# Patient Record
Sex: Female | Born: 1965 | Race: White | Hispanic: No | Smoking: Former smoker
Health system: Southern US, Community
[De-identification: ages and names within clinical notes are randomized; demographics above are authoritative.]

## PROBLEM LIST (undated history)

## (undated) HISTORY — PX: BREAST ENHANCEMENT SURGERY: SHX7

## (undated) HISTORY — PX: TUBAL LIGATION: SHX77

---

## 2003-12-07 ENCOUNTER — Other Ambulatory Visit: Payer: Self-pay

## 2005-03-15 ENCOUNTER — Ambulatory Visit: Payer: Self-pay | Admitting: Internal Medicine

## 2008-01-03 ENCOUNTER — Ambulatory Visit: Payer: Self-pay | Admitting: Unknown Physician Specialty

## 2008-11-01 HISTORY — PX: AUGMENTATION MAMMAPLASTY: SUR837

## 2009-04-06 ENCOUNTER — Emergency Department: Payer: Self-pay | Admitting: Emergency Medicine

## 2009-11-01 HISTORY — PX: ABDOMINAL HYSTERECTOMY: SHX81

## 2009-11-01 HISTORY — PX: OOPHORECTOMY: SHX86

## 2010-04-07 ENCOUNTER — Emergency Department: Payer: Self-pay | Admitting: Emergency Medicine

## 2010-05-29 ENCOUNTER — Ambulatory Visit: Payer: Self-pay | Admitting: Unknown Physician Specialty

## 2010-06-08 ENCOUNTER — Ambulatory Visit: Payer: Self-pay | Admitting: Unknown Physician Specialty

## 2010-06-10 ENCOUNTER — Ambulatory Visit: Payer: Self-pay | Admitting: Unknown Physician Specialty

## 2011-02-08 ENCOUNTER — Emergency Department: Payer: Self-pay | Admitting: Internal Medicine

## 2011-02-17 ENCOUNTER — Ambulatory Visit: Payer: Self-pay | Admitting: Urology

## 2011-04-23 ENCOUNTER — Emergency Department: Payer: Self-pay | Admitting: Emergency Medicine

## 2011-07-28 ENCOUNTER — Emergency Department: Payer: Self-pay | Admitting: Emergency Medicine

## 2011-07-31 ENCOUNTER — Ambulatory Visit: Payer: Self-pay | Admitting: Unknown Physician Specialty

## 2011-08-02 ENCOUNTER — Ambulatory Visit: Payer: Self-pay | Admitting: Gynecologic Oncology

## 2011-08-02 ENCOUNTER — Ambulatory Visit: Payer: Self-pay | Admitting: Internal Medicine

## 2011-08-03 ENCOUNTER — Ambulatory Visit: Payer: Self-pay

## 2011-08-09 ENCOUNTER — Ambulatory Visit: Payer: Self-pay

## 2011-08-10 ENCOUNTER — Ambulatory Visit: Payer: Self-pay | Admitting: Gynecologic Oncology

## 2011-08-18 LAB — CA 125: CA 125: 16.5 U/mL (ref 0.0–34.0)

## 2011-08-28 ENCOUNTER — Ambulatory Visit: Payer: Self-pay | Admitting: Unknown Physician Specialty

## 2011-09-02 ENCOUNTER — Ambulatory Visit: Payer: Self-pay | Admitting: Gynecologic Oncology

## 2011-10-06 ENCOUNTER — Ambulatory Visit: Payer: Self-pay | Admitting: Gynecologic Oncology

## 2011-10-28 LAB — HM COLONOSCOPY

## 2012-03-02 ENCOUNTER — Ambulatory Visit: Payer: Self-pay | Admitting: Internal Medicine

## 2012-05-29 ENCOUNTER — Ambulatory Visit: Payer: Self-pay

## 2012-05-29 LAB — URINALYSIS, COMPLETE
Ketone: NEGATIVE
Ph: 6 (ref 4.5–8.0)
Protein: NEGATIVE
Specific Gravity: 1.025 (ref 1.003–1.030)

## 2013-02-22 ENCOUNTER — Ambulatory Visit: Payer: Self-pay

## 2013-02-22 LAB — URINALYSIS, COMPLETE
Bilirubin,UR: NEGATIVE
Blood: NEGATIVE
Ketone: NEGATIVE
Leukocyte Esterase: NEGATIVE
Ph: 6 (ref 4.5–8.0)
Protein: NEGATIVE
Specific Gravity: >=1.03 (ref 1.003–1.030)
WBC UR: NONE SEEN /HPF (ref 0–5)

## 2013-02-24 LAB — URINE CULTURE

## 2013-09-04 LAB — CBC AND DIFFERENTIAL
HEMATOCRIT: 41 % (ref 36–46)
HEMOGLOBIN: 14.4 g/dL (ref 12.0–16.0)

## 2013-09-04 LAB — TSH: TSH: 1.45 u[IU]/mL (ref 0.41–5.90)

## 2015-09-30 ENCOUNTER — Encounter: Payer: Self-pay | Admitting: Internal Medicine

## 2015-09-30 ENCOUNTER — Other Ambulatory Visit: Payer: Self-pay | Admitting: Internal Medicine

## 2015-09-30 DIAGNOSIS — H1010 Acute atopic conjunctivitis, unspecified eye: Secondary | ICD-10-CM | POA: Insufficient documentation

## 2015-09-30 DIAGNOSIS — J4520 Mild intermittent asthma, uncomplicated: Secondary | ICD-10-CM | POA: Insufficient documentation

## 2015-09-30 DIAGNOSIS — L719 Rosacea, unspecified: Secondary | ICD-10-CM | POA: Insufficient documentation

## 2015-09-30 DIAGNOSIS — K219 Gastro-esophageal reflux disease without esophagitis: Secondary | ICD-10-CM | POA: Insufficient documentation

## 2015-09-30 DIAGNOSIS — F17201 Nicotine dependence, unspecified, in remission: Secondary | ICD-10-CM | POA: Insufficient documentation

## 2015-09-30 DIAGNOSIS — F909 Attention-deficit hyperactivity disorder, unspecified type: Secondary | ICD-10-CM | POA: Insufficient documentation

## 2015-09-30 DIAGNOSIS — M26629 Arthralgia of temporomandibular joint, unspecified side: Secondary | ICD-10-CM | POA: Insufficient documentation

## 2016-11-30 ENCOUNTER — Other Ambulatory Visit: Payer: Self-pay | Admitting: Internal Medicine

## 2017-01-10 ENCOUNTER — Ambulatory Visit: Payer: Self-pay | Admitting: Internal Medicine

## 2017-01-11 ENCOUNTER — Encounter: Payer: Self-pay | Admitting: Internal Medicine

## 2017-01-11 ENCOUNTER — Ambulatory Visit (INDEPENDENT_AMBULATORY_CARE_PROVIDER_SITE_OTHER): Payer: BLUE CROSS/BLUE SHIELD | Admitting: Internal Medicine

## 2017-01-11 VITALS — BP 94/62 | HR 88 | Ht 68.0 in | Wt 274.0 lb

## 2017-01-11 DIAGNOSIS — J4520 Mild intermittent asthma, uncomplicated: Secondary | ICD-10-CM | POA: Diagnosis not present

## 2017-01-11 DIAGNOSIS — F17200 Nicotine dependence, unspecified, uncomplicated: Secondary | ICD-10-CM

## 2017-01-11 DIAGNOSIS — R21 Rash and other nonspecific skin eruption: Secondary | ICD-10-CM | POA: Diagnosis not present

## 2017-01-11 MED ORDER — TRIAMCINOLONE ACETONIDE 0.1 % EX CREA: 1.0000 | TOPICAL_CREAM | Freq: Two times a day (BID) | CUTANEOUS | 0 refills | Status: DC

## 2017-01-11 MED ORDER — FLUCONAZOLE 100 MG PO TABS: 100.0000 mg | ORAL_TABLET | Freq: Every day | ORAL | 0 refills | Status: DC

## 2017-01-11 MED ORDER — MOMETASONE FURO-FORMOTEROL FUM 200-5 MCG/ACT IN AERO: 2.0000 | INHALATION_SPRAY | Freq: Every day | RESPIRATORY_TRACT | 12 refills | Status: DC

## 2017-01-11 NOTE — Progress Notes (Signed)
Date:  01/11/2017   Name:  Shannon Stanton R Marasigan   DOB:  12/22/1965   MRN:  [id number]   Chief Complaint: Rash (Started on left arm and spread. Itches, and red. Started 1 year ago, but getting worse. Has on face, arms, land legs. Skin feels dry.) and Foreign Body in Ear (When pt moves jaw, she feels like somethin moves in Left ear. Wants to make sure nothing is in there.) Rash  This is a chronic problem. The current episode started more than 1 year ago. The problem has been gradually worsening since onset. The affected locations include the chest, left arm, left lower leg, right arm and right lower leg. The rash is characterized by dryness, redness and itchiness. She was exposed to nothing. Pertinent negatives include no cough, fatigue, fever or shortness of breath. Past treatments include antihistamine, anti-itch cream and moisturizer. The treatment provided no relief. Her past medical history is significant for asthma.  Asthma  She complains of chest tightness and wheezing. There is no cough or shortness of breath. This is a recurrent problem. The problem occurs intermittently. The problem has been waxing and waning. Associated symptoms include ear pain. Pertinent negatives include no chest pain or fever. Her symptoms are alleviated by beta-agonist and steroid inhaler. She reports complete improvement on treatment. Risk factors for lung disease include smoking/tobacco exposure. Her past medical history is significant for asthma.   She feels like there is something in her ear canal on occasion.  No known FB.  No pain or bleeding.     Review of Systems  Constitutional: Negative for chills, fatigue and fever.  HENT: Positive for ear pain.   Eyes: Negative for visual disturbance.  Respiratory: Positive for wheezing. Negative for cough, chest tightness and shortness of breath.   Cardiovascular: Negative for chest pain, palpitations and leg swelling.  Skin: Positive for rash.    Patient Active  Problem List   Diagnosis Date Noted  . Compulsive tobacco user syndrome 09/30/2015  . Attention deficit disorder with hyperactivity 09/30/2015  . Allergic conjunctivitis 09/30/2015  . Asthma, mild intermittent 09/30/2015  . Acid reflux 09/30/2015  . Acne erythematosa 09/30/2015  . Arthralgia of temporomandibular joint 09/30/2015    Prior to Admission medications   Medication Sig Start Date End Date Taking? Authorizing Provider  fexofenadine (ALLEGRA) 30 MG tablet Take 30 mg by mouth 2 (two) times daily.   Yes Historical Provider, MD  mometasone-formoterol (DULERA) 200-5 MCG/ACT AERO Inhale 2 puffs into the lungs daily. 02/06/15  Yes Historical Provider, MD    Allergies  Allergen Reactions  . Augmentin [Amoxicillin-Pot Clavulanate] Other (See Comments)    Liver shuts down  . Meloxicam Shortness Of Breath  . Hydrocodone Rash  . Penicillins Rash    Past Surgical History:  Procedure Laterality Date  . ABDOMINAL HYSTERECTOMY  2011   endometrial dysplasia  . BREAST ENHANCEMENT SURGERY    . OOPHORECTOMY Right 2011  . TUBAL LIGATION      Social History  Substance Use Topics  . Smoking status: Current Every Day Smoker  . Smokeless tobacco: Never Used  . Alcohol use 1.2 oz/week    2 Standard drinks or equivalent per week     Medication list has been reviewed and updated.   Physical Exam  Constitutional: She is oriented to person, place, and time. She appears well-developed. No distress.  HENT:  Head: Normocephalic and atraumatic.  Right Ear: Tympanic membrane and ear canal normal.  Left Ear: Tympanic membrane  and ear canal normal.  Neck: Normal range of motion.  Cardiovascular: Normal rate, regular rhythm and normal heart sounds.   Pulmonary/Chest: Effort normal and breath sounds normal. No respiratory distress.  Musculoskeletal: Normal range of motion.  Neurological: She is alert and oriented to person, place, and time.  Skin: Skin is warm and dry. No rash noted.    Diffuse thickening c/w chronic excoriation over forearms and chest.  Tiny raised papules without purulent scattered throughout. No vesicles.  No plaques.  Psychiatric: She has a normal mood and affect. Her speech is normal and behavior is normal. Thought content normal.  Nursing note and vitals reviewed.   BP 94/62   Pulse 88   Ht 5\' 8"  (1.727 m)   Wt 274 lb (124.3 kg)   SpO2 97%   BMI 41.66 kg/m   Assessment and Plan: 1. Rash May need to consult Dermatology - triamcinolone cream (KENALOG) 0.1 %; Apply 1 application topically 2 (two) times daily.  Dispense: 30 g; Refill: 0 - fluconazole (DIFLUCAN) 100 MG tablet; Take 1 tablet (100 mg total) by mouth daily.  Dispense: 10 tablet; Refill: 0  2. Mild intermittent asthma without complication Doing well on maintenance inhaler - mometasone-formoterol (DULERA) 200-5 MCG/ACT AERO; Inhale 2 puffs into the lungs daily.  Dispense: 1 Inhaler; Refill: 12  3. Compulsive tobacco user syndrome Encouraged to work on quitting   Meds ordered this encounter  Medications  . mometasone-formoterol (DULERA) 200-5 MCG/ACT AERO    Sig: Inhale 2 puffs into the lungs daily.    Dispense:  1 Inhaler    Refill:  12  . triamcinolone cream (KENALOG) 0.1 %    Sig: Apply 1 application topically 2 (two) times daily.    Dispense:  30 g    Refill:  0  . fluconazole (DIFLUCAN) 100 MG tablet    Sig: Take 1 tablet (100 mg total) by mouth daily.    Dispense:  10 tablet    Refill:  0    Bari Edward, MD [facility]  01/11/2017

## 2017-02-15 DIAGNOSIS — D2261 Melanocytic nevi of right upper limb, including shoulder: Secondary | ICD-10-CM | POA: Diagnosis not present

## 2017-02-15 DIAGNOSIS — L309 Dermatitis, unspecified: Secondary | ICD-10-CM | POA: Diagnosis not present

## 2017-03-01 DIAGNOSIS — L309 Dermatitis, unspecified: Secondary | ICD-10-CM | POA: Diagnosis not present

## 2017-10-11 ENCOUNTER — Ambulatory Visit: Payer: BLUE CROSS/BLUE SHIELD | Admitting: Internal Medicine

## 2017-10-11 ENCOUNTER — Encounter: Payer: Self-pay | Admitting: Internal Medicine

## 2017-10-11 VITALS — BP 122/78 | HR 112 | Temp 98.0°F | Ht 68.0 in | Wt 180.0 lb

## 2017-10-11 DIAGNOSIS — N951 Menopausal and female climacteric states: Secondary | ICD-10-CM | POA: Insufficient documentation

## 2017-10-11 DIAGNOSIS — F17201 Nicotine dependence, unspecified, in remission: Secondary | ICD-10-CM

## 2017-10-11 DIAGNOSIS — N3000 Acute cystitis without hematuria: Secondary | ICD-10-CM | POA: Diagnosis not present

## 2017-10-11 LAB — POCT URINALYSIS DIPSTICK
Bilirubin, UA: NEGATIVE
Blood, UA: NEGATIVE
Glucose, UA: NEGATIVE
Ketones, UA: NEGATIVE
NITRITE UA: NEGATIVE
SPEC GRAV UA: 1.015 (ref 1.010–1.025)
UROBILINOGEN UA: 0.2 U/dL
pH, UA: 5 (ref 5.0–8.0)

## 2017-10-11 MED ORDER — SULFAMETHOXAZOLE-TRIMETHOPRIM 800-160 MG PO TABS: 1.0000 | ORAL_TABLET | Freq: Two times a day (BID) | ORAL | 0 refills | Status: AC

## 2017-10-11 NOTE — Progress Notes (Signed)
Date:  10/11/2017   Name:  Shannon Stanton   DOB:  07/04/1966   MRN:  [id number]   Chief Complaint: Urinary Tract Infection (Tried lots of meds at home including Azo, dicylclomine antibiotics. Started last Tuesday - a lot of burning when urinating and discomfort in bladder area. ); Menopause (Night sweats are bad, not sleeping well. Wants to know what can help.); and Sore Throat (Started yesterday- coughing with production. Not a color- clear. ) Urinary Tract Infection   This is a new problem. The current episode started in the past 7 days. The problem has been unchanged. The quality of the pain is described as burning. The pain is mild. There has been no fever. Pertinent negatives include no chills.  Sore Throat   Pertinent negatives include no coughing, headaches or shortness of breath.   Menopause sx - s/p hysterectomy.  Started with some sx several years ago, the symptoms are worsening - severe sweats at night, and hot during the day.  She has not taken any medications.  Review of Systems  Constitutional: Positive for diaphoresis. Negative for chills, fatigue and fever.  HENT: Positive for sore throat. Negative for postnasal drip and sinus pain.   Respiratory: Negative for cough, chest tightness, shortness of breath and wheezing.   Cardiovascular: Negative for chest pain and palpitations.  Musculoskeletal: Negative for arthralgias and gait problem.  Skin: Positive for rash.  Neurological: Negative for dizziness, tremors, weakness and headaches.  Psychiatric/Behavioral: Positive for sleep disturbance. Negative for dysphoric mood.    Patient Active Problem List   Diagnosis Date Noted  . Compulsive tobacco user syndrome 09/30/2015  . Attention deficit disorder with hyperactivity 09/30/2015  . Allergic conjunctivitis 09/30/2015  . Asthma, mild intermittent 09/30/2015  . Acid reflux 09/30/2015  . Acne erythematosa 09/30/2015  . Arthralgia of temporomandibular joint 09/30/2015     Prior to Admission medications   Medication Sig Start Date End Date Taking? Authorizing Provider  fexofenadine (ALLEGRA) 30 MG tablet Take 30 mg by mouth 2 (two) times daily.   Yes [provider]  mometasone-formoterol (DULERA) 200-5 MCG/ACT AERO Inhale 2 puffs into the lungs daily. 01/11/17  Yes Reubin MilanBerglund, Lyrika Souders H, MD  triamcinolone cream (KENALOG) 0.1 % Apply 1 application topically 2 (two) times daily. 01/11/17  Yes Reubin MilanBerglund, Ryna Beckstrom H, MD    Allergies  Allergen Reactions  . Augmentin [Amoxicillin-Pot Clavulanate] Other (See Comments)    Liver shuts down  . Meloxicam Shortness Of Breath  . Hydrocodone Rash  . Penicillins Rash    Past Surgical History:  Procedure Laterality Date  . ABDOMINAL HYSTERECTOMY  2011   endometrial dysplasia  . BREAST ENHANCEMENT SURGERY    . OOPHORECTOMY Right 2011  . TUBAL LIGATION      Social History   Tobacco Use  . Smoking status: Former Smoker    Last attempt to quit: 05/2017    Years since quitting: 0.4  . Smokeless tobacco: Never Used  Substance Use Topics  . Alcohol use: Yes    Alcohol/week: 1.2 oz    Types: 2 Standard drinks or equivalent per week  . Drug use: Not on file     Medication list has been reviewed and updated.  PHQ 2/9 Scores 10/11/2017  PHQ - 2 Score 0    Physical Exam  Constitutional: She is oriented to person, place, and time. She appears well-developed. No distress.  HENT:  Head: Normocephalic and atraumatic.  Nose: Right sinus exhibits no maxillary sinus tenderness. Left sinus  exhibits no maxillary sinus tenderness.  Mouth/Throat: No posterior oropharyngeal edema or posterior oropharyngeal erythema.  Neck: Normal range of motion. Neck supple.  Cardiovascular: Normal rate, regular rhythm and normal heart sounds.  Pulmonary/Chest: Effort normal and breath sounds normal. No respiratory distress. She has no wheezes.  Abdominal: Soft. Bowel sounds are normal. She exhibits no distension. There is no  tenderness.  Musculoskeletal: Normal range of motion. She exhibits no edema.  Neurological: She is alert and oriented to person, place, and time.  Skin: Skin is warm and dry. No rash noted.  Psychiatric: She has a normal mood and affect. Her behavior is normal. Thought content normal.  Nursing note and vitals reviewed.   BP 122/78   Pulse (!) 112   Temp 98 F (36.7 C) (Oral)   Ht 5\' 8"  (1.727 m)   Wt 180 lb (81.6 kg)   SpO2 97%   BMI 27.37 kg/m   Assessment and Plan: 1. Acute cystitis without hematuria Push fluids - POCT urinalysis dipstick - sulfamethoxazole-trimethoprim (BACTRIM DS,SEPTRA DS) 800-160 MG tablet; Take 1 tablet by mouth 2 (two) times daily for 10 days.  Dispense: 20 tablet; Refill: 0  2. Menopausal symptoms Begin otc black cohosh or soy  3. Tobacco use disorder, mild, in sustained remission   Meds ordered this encounter  Medications  . sulfamethoxazole-trimethoprim (BACTRIM DS,SEPTRA DS) 800-160 MG tablet    Sig: Take 1 tablet by mouth 2 (two) times daily for 10 days.    Dispense:  20 tablet    Refill:  0    Partially dictated using Animal nutritionistDragon software. Any errors are unintentional.  Bari EdwardLaura Grayce Budden, MD [facility]  10/11/2017

## 2017-10-11 NOTE — Patient Instructions (Signed)
Get a soy or black cohosh supplement (ideally a combination product)

## 2017-11-03 ENCOUNTER — Other Ambulatory Visit: Payer: Self-pay | Admitting: Internal Medicine

## 2017-11-03 DIAGNOSIS — Z1231 Encounter for screening mammogram for malignant neoplasm of breast: Secondary | ICD-10-CM

## 2017-11-14 ENCOUNTER — Ambulatory Visit
Admission: RE | Admit: 2017-11-14 | Discharge: 2017-11-14 | Disposition: A | Discharge status: Home / Self Care | Payer: BLUE CROSS/BLUE SHIELD | Source: Ambulatory Visit | Attending: Internal Medicine | Admitting: Internal Medicine

## 2017-11-14 ENCOUNTER — Encounter (INDEPENDENT_AMBULATORY_CARE_PROVIDER_SITE_OTHER): Payer: Self-pay

## 2017-11-14 DIAGNOSIS — Z1231 Encounter for screening mammogram for malignant neoplasm of breast: Secondary | ICD-10-CM | POA: Diagnosis not present

## 2018-08-17 ENCOUNTER — Other Ambulatory Visit: Payer: Self-pay | Admitting: Internal Medicine

## 2018-08-17 DIAGNOSIS — J4520 Mild intermittent asthma, uncomplicated: Secondary | ICD-10-CM

## 2019-05-10 ENCOUNTER — Encounter: Payer: Self-pay | Admitting: Internal Medicine

## 2019-05-10 ENCOUNTER — Other Ambulatory Visit: Payer: Self-pay

## 2019-05-10 ENCOUNTER — Ambulatory Visit (INDEPENDENT_AMBULATORY_CARE_PROVIDER_SITE_OTHER): Payer: BC Managed Care – PPO | Admitting: Internal Medicine

## 2019-05-10 VITALS — BP 102/64 | HR 76 | Temp 97.7°F | Ht 68.0 in | Wt 188.0 lb

## 2019-05-10 DIAGNOSIS — Z23 Encounter for immunization: Secondary | ICD-10-CM

## 2019-05-10 DIAGNOSIS — F17201 Nicotine dependence, unspecified, in remission: Secondary | ICD-10-CM | POA: Diagnosis not present

## 2019-05-10 DIAGNOSIS — J4520 Mild intermittent asthma, uncomplicated: Secondary | ICD-10-CM | POA: Diagnosis not present

## 2019-05-10 DIAGNOSIS — Z1231 Encounter for screening mammogram for malignant neoplasm of breast: Secondary | ICD-10-CM

## 2019-05-10 DIAGNOSIS — N951 Menopausal and female climacteric states: Secondary | ICD-10-CM

## 2019-05-10 DIAGNOSIS — Z0000 Encounter for general adult medical examination without abnormal findings: Secondary | ICD-10-CM | POA: Diagnosis not present

## 2019-05-10 DIAGNOSIS — E538 Deficiency of other specified B group vitamins: Secondary | ICD-10-CM | POA: Diagnosis not present

## 2019-05-10 LAB — POCT URINALYSIS DIPSTICK
Bilirubin, UA: NEGATIVE
Blood, UA: NEGATIVE
Glucose, UA: NEGATIVE
Ketones, UA: NEGATIVE
Leukocytes, UA: NEGATIVE
Nitrite, UA: NEGATIVE
Protein, UA: NEGATIVE
Spec Grav, UA: >=1.03 — AB (ref 1.010–1.025)
Urobilinogen, UA: 0.2 E.U./dL
pH, UA: 6 (ref 5.0–8.0)

## 2019-05-10 MED ORDER — ALBUTEROL SULFATE HFA 108 (90 BASE) MCG/ACT IN AERS: 2.0000 | INHALATION_SPRAY | Freq: Four times a day (QID) | RESPIRATORY_TRACT | 5 refills | Status: DC | PRN

## 2019-05-10 MED ORDER — VENLAFAXINE HCL ER 37.5 MG PO CP24: 37.5000 mg | ORAL_CAPSULE | Freq: Every day | ORAL | 3 refills | Status: DC

## 2019-05-10 NOTE — Progress Notes (Signed)
Date:  05/10/2019   Name:  Shannon Stanton   DOB:  1966-06-16   MRN:  [id number]   Chief Complaint: Annual Exam (breast exam. discuss menopause symptoms.) Shannon Stanton is a 54 y.o. female who presents today for her Complete Annual Exam. She feels well. She reports exercising. She reports she is sleeping poorly due to night sweats. She denies breast issues.  Colonoscopy 2012 Mammogram 2019 No pap needed (hysterectomy)  Asthma She complains of wheezing. There is no cough or shortness of breath. The problem occurs every several days. Pertinent negatives include no chest pain, fever, headaches or trouble swallowing. Her symptoms are aggravated by change in weather (wearing a mask). Her symptoms are alleviated by beta-agonist and steroid inhaler. She reports complete improvement on treatment. Her past medical history is significant for asthma.   Menopausal sx - she is still struggling with hot flashes and night sweats as well as mood disorder.  She is reluctant to take medications.  She is not a candidate for HRT due to family hx breast cancer.  Her significant others sees extreme mood swings as well.   Review of Systems  Constitutional: Positive for diaphoresis. Negative for chills and fever.  HENT: Negative for congestion, hearing loss, tinnitus, trouble swallowing and voice change.   Eyes: Negative for visual disturbance.  Respiratory: Positive for wheezing. Negative for cough, chest tightness and shortness of breath.   Cardiovascular: Negative for chest pain, palpitations and leg swelling.  Gastrointestinal: Negative for abdominal pain, constipation, diarrhea and vomiting.  Endocrine: Negative for polydipsia and polyuria.  Genitourinary: Negative for dysuria, frequency, genital sores, vaginal bleeding and vaginal discharge.  Musculoskeletal: Negative for arthralgias, gait problem and joint swelling.  Skin: Negative for color change and rash.  Allergic/Immunologic: Negative for  environmental allergies.  Neurological: Negative for dizziness, tremors, light-headedness and headaches.  Hematological: Negative for adenopathy. Does not bruise/bleed easily.  Psychiatric/Behavioral: Positive for sleep disturbance. Negative for dysphoric mood. The patient is not nervous/anxious.     Patient Active Problem List   Diagnosis Date Noted  . Menopausal symptoms 10/11/2017  . Tobacco use disorder, mild, in sustained remission 09/30/2015  . Attention deficit disorder with hyperactivity 09/30/2015  . Asthma, mild intermittent 09/30/2015  . Acid reflux 09/30/2015  . Acne erythematosa 09/30/2015  . Arthralgia of temporomandibular joint 09/30/2015    Allergies  Allergen Reactions  . Augmentin [Amoxicillin-Pot Clavulanate] Other (See Comments)    Liver shuts down  . Meloxicam Shortness Of Breath  . Hydrocodone Rash  . Penicillins Rash    Past Surgical History:  Procedure Laterality Date  . ABDOMINAL HYSTERECTOMY  2011   endometrial dysplasia  . AUGMENTATION MAMMAPLASTY Bilateral 2010  . OOPHORECTOMY Right 2011  . TUBAL LIGATION      Social History   Tobacco Use  . Smoking status: Former Smoker    Quit date: 05/2017    Years since quitting: 2.0  . Smokeless tobacco: Never Used  Substance Use Topics  . Alcohol use: Yes    Alcohol/week: 2.0 standard drinks    Types: 2 Standard drinks or equivalent per week  . Drug use: Not on file     Medication list has been reviewed and updated.  Current Meds  Medication Sig  . DULERA 200-5 MCG/ACT AERO INHALE 2 PUFFS INTO THE LUNGS DAILY.  . fexofenadine (ALLEGRA) 30 MG tablet Take 30 mg by mouth 2 (two) times daily.  Marland Kitchen triamcinolone cream (KENALOG) 0.1 % Apply 1 application topically 2 (two)  times daily.    PHQ 2/9 Scores 05/10/2019 10/11/2017  PHQ - 2 Score 0 0  PHQ- 9 Score 9 -    BP Readings from Last 3 Encounters:  05/10/19 102/64  10/11/17 122/78  01/11/17 94/62    Physical Exam Vitals signs and nursing  note reviewed.  Constitutional:      General: She is not in acute distress.    Appearance: She is well-developed.  HENT:     Head: Normocephalic and atraumatic.     Right Ear: Tympanic membrane and ear canal normal.     Left Ear: Tympanic membrane and ear canal normal.     Nose:     Right Sinus: No maxillary sinus tenderness.     Left Sinus: No maxillary sinus tenderness.     Mouth/Throat:     Pharynx: Uvula midline.  Eyes:     General: No scleral icterus.       Right eye: No discharge.        Left eye: No discharge.     Conjunctiva/sclera: Conjunctivae normal.  Neck:     Musculoskeletal: Normal range of motion. No erythema.     Thyroid: No thyromegaly.     Vascular: No carotid bruit.  Cardiovascular:     Rate and Rhythm: Normal rate and regular rhythm.     Pulses: Normal pulses.     Heart sounds: Normal heart sounds.  Pulmonary:     Effort: Pulmonary effort is normal. No respiratory distress.     Breath sounds: No wheezing.  Chest:     Breasts:        Right: No mass, nipple discharge, skin change or tenderness.        Left: No mass, nipple discharge, skin change or tenderness.  Abdominal:     General: Bowel sounds are normal.     Palpations: Abdomen is soft.     Tenderness: There is no abdominal tenderness.  Musculoskeletal: Normal range of motion.     Right lower leg: No edema.     Left lower leg: No edema.  Lymphadenopathy:     Cervical: No cervical adenopathy.  Skin:    General: Skin is warm and dry.     Capillary Refill: Capillary refill takes less than 2 seconds.     Findings: No rash.  Neurological:     Mental Status: She is alert and oriented to person, place, and time.     Cranial Nerves: No cranial nerve deficit.     Sensory: No sensory deficit.     Deep Tendon Reflexes: Reflexes are normal and symmetric.  Psychiatric:        Attention and Perception: Attention normal.        Mood and Affect: Mood normal.        Speech: Speech normal.        Behavior:  Behavior normal.        Thought Content: Thought content normal.     Wt Readings from Last 3 Encounters:  05/10/19 188 lb (85.3 kg)  10/11/17 180 lb (81.6 kg)  01/11/17 274 lb (124.3 kg)    BP 102/64   Pulse 76   Temp 97.7 F (36.5 C) (Temporal)   Ht 5\' 8"  (1.727 m)   Wt 188 lb (85.3 kg)   SpO2 96%   BMI 28.59 kg/m   Assessment and Plan: 1. Annual physical exam Normal exam Continue healthy diet and exercise - Comprehensive metabolic panel - Lipid panel - POCT urinalysis dipstick - TSH -  Vitamin B12  2. Encounter for screening [facility] - MM 3D SCREEN BREAST BILATERAL; Future  3. Mild intermittent asthma without complication Doing well, continue current daily treatment - CBC with Differential/Platelet - albuterol (VENTOLIN HFA) 108 (90 Base) MCG/ACT inhaler; Inhale 2 puffs into the lungs every 6 (six) hours as needed for wheezing or shortness of breath.  Dispense: 18 g; Refill: 5  4. Tobacco use disorder, mild, in sustained remission Remains tobacco free  5. Menopausal symptoms Pt urged to try low dose effexor - Rx given and she will consider trying it - venlafaxine XR (EFFEXOR XR) 37.5 MG 24 hr capsule; Take 1 capsule (37.5 mg total) by mouth daily with breakfast.  Dispense: 30 capsule; Refill: 3  6. Need for diphtheria-tetanus-pertussis (Tdap) vaccine - Tdap vaccine greater than or equal to 7yo IM   Partially dictated using Animal nutritionistDragon software. Any errors are unintentional.  Bari EdwardLaura Rasmus Preusser, MD [facility]  05/10/2019

## 2019-05-11 LAB — COMPREHENSIVE METABOLIC PANEL
ALT: 10 IU/L (ref 0–32)
AST: 18 IU/L (ref 0–40)
Albumin/Globulin Ratio: 2.1 (ref 1.2–2.2)
Albumin: 4.7 g/dL (ref 3.8–4.9)
Alkaline Phosphatase: 78 IU/L (ref 39–117)
BUN/Creatinine Ratio: 15 (ref 9–23)
BUN: 16 mg/dL (ref 6–24)
Bilirubin Total: 1.1 mg/dL (ref 0.0–1.2)
CO2: 22 mmol/L (ref 20–29)
Calcium: 9.6 mg/dL (ref 8.7–10.2)
Chloride: 102 mmol/L (ref 96–106)
Creatinine, Ser: 1.1 mg/dL — ABNORMAL HIGH (ref 0.57–1.00)
GFR calc Af Amer: 67 mL/min/{1.73_m2} (ref >59)
GFR calc non Af Amer: 58 mL/min/{1.73_m2} — ABNORMAL LOW (ref >59)
Globulin, Total: 2.2 g/dL (ref 1.5–4.5)
Glucose: 91 mg/dL (ref 65–99)
Potassium: 4.6 mmol/L (ref 3.5–5.2)
Sodium: 141 mmol/L (ref 134–144)
Total Protein: 6.9 g/dL (ref 6.0–8.5)

## 2019-05-11 LAB — CBC WITH DIFFERENTIAL/PLATELET
Basophils Absolute: 0 10*3/uL (ref 0.0–0.2)
Basos: 1 %
EOS (ABSOLUTE): 0.2 10*3/uL (ref 0.0–0.4)
Eos: 4 %
Hematocrit: 43.2 % (ref 34.0–46.6)
Hemoglobin: 14.8 g/dL (ref 11.1–15.9)
Immature Grans (Abs): 0 10*3/uL (ref 0.0–0.1)
Immature Granulocytes: 1 %
Lymphocytes Absolute: 1.4 10*3/uL (ref 0.7–3.1)
Lymphs: 25 %
MCH: 31.2 pg (ref 26.6–33.0)
MCHC: 34.3 g/dL (ref 31.5–35.7)
MCV: 91 fL (ref 79–97)
Monocytes Absolute: 0.5 10*3/uL (ref 0.1–0.9)
Monocytes: 10 %
Neutrophils Absolute: 3.4 10*3/uL (ref 1.4–7.0)
Neutrophils: 59 %
Platelets: 180 10*3/uL (ref 150–450)
RBC: 4.74 x10E6/uL (ref 3.77–5.28)
RDW: 12.6 % (ref 11.7–15.4)
WBC: 5.6 10*3/uL (ref 3.4–10.8)

## 2019-05-11 LAB — LIPID PANEL
Chol/HDL Ratio: 2.2 ratio (ref 0.0–4.4)
Cholesterol, Total: 207 mg/dL — ABNORMAL HIGH (ref 100–199)
HDL: 95 mg/dL (ref >39)
LDL Calculated: 94 mg/dL (ref 0–99)
Triglycerides: 92 mg/dL (ref 0–149)
VLDL Cholesterol Cal: 18 mg/dL (ref 5–40)

## 2019-05-11 LAB — TSH: TSH: 1.51 u[IU]/mL (ref 0.450–4.500)

## 2019-05-11 LAB — VITAMIN B12: Vitamin B-12: 237 pg/mL (ref 232–1245)

## 2019-05-18 ENCOUNTER — Telehealth: Payer: Self-pay

## 2019-05-18 NOTE — Telephone Encounter (Signed)
Patient called saying she is having pressure in her vagina, and bladder area. She cannot pee with a full stream, and having a lot of blood in her urine. She wants to see a kidney specialist- especially after knowing her kidney function has slightly decreased.  Told patient Dr. Army Melia is on vacation and will not be back until Tuesday but that I recommend her being seen at [facility]. She should not have a lot of blood coming out, and her pain is concerning as well.  She verbalized understanding and agreed to be evaluated at [facility]. Will go today.

## 2019-06-11 ENCOUNTER — Other Ambulatory Visit: Payer: Self-pay

## 2019-06-11 DIAGNOSIS — Z20822 Contact with and (suspected) exposure to covid-19: Secondary | ICD-10-CM

## 2019-06-12 LAB — NOVEL CORONAVIRUS, NAA: SARS-CoV-2, NAA: NOT DETECTED

## 2019-06-20 ENCOUNTER — Encounter: Payer: Self-pay | Admitting: Internal Medicine

## 2019-06-20 DIAGNOSIS — E538 Deficiency of other specified B group vitamins: Secondary | ICD-10-CM | POA: Insufficient documentation

## 2019-06-26 ENCOUNTER — Encounter: Payer: Self-pay | Admitting: Internal Medicine

## 2019-06-26 ENCOUNTER — Ambulatory Visit (INDEPENDENT_AMBULATORY_CARE_PROVIDER_SITE_OTHER): Payer: BC Managed Care – PPO | Admitting: Internal Medicine

## 2019-06-26 VITALS — Temp 97.4°F | Ht 68.0 in | Wt 188.0 lb

## 2019-06-26 DIAGNOSIS — R1013 Epigastric pain: Secondary | ICD-10-CM | POA: Diagnosis not present

## 2019-06-26 NOTE — Progress Notes (Signed)
Date:  06/26/2019   Name:  Shannon Stanton   DOB:  05/21/1966   MRN:  [id number]  I connected with this patient, Shannon Stanton, by telephone at the patient's home.  I verified that I am speaking with the correct person using two identifiers. This visit was conducted via telephone due to the Covid-19 outbreak from my office at [facility] in [place], [place]. I discussed the limitations, risks, security and privacy concerns of performing an evaluation and management service by telephone. I also discussed with the patient that there may be a patient responsible charge related to this service. The patient expressed understanding and agreed to proceed.  Chief Complaint: Abdominal Pain (TELEPHONE VISIT= X 2 weeks ago- was tested for covid with Negative result. Having diarrhea after eating greasy food. Feels sick to her stomach. Drinking glass of wine causes her to vomit. Abdomen is sore when pressing on it right beneath the rib cage. Pt still has her gallbladder and appendix.)  Abdominal Pain This is a new problem. The current episode started 1 to 4 weeks ago. The pain is located in the epigastric region and RUQ. The quality of the pain is a sensation of fullness. The abdominal pain does not radiate. Associated symptoms include diarrhea and vomiting (two episodes previously and excessive vomiting last night). Pertinent negatives include no belching, fever, flatus, headaches, hematochezia, nausea or weight loss. Associated symptoms comments: Initially diarrhea 4-5 times per day, then had 4 days of constipation and now about 2-3 stools per day - softer and very pale..  She became more concerned after drinking a half a glass of wine last night she had vomiting for several hours.   Review of Systems  Constitutional: Negative for chills, fatigue, fever, unexpected weight change and weight loss.  HENT: Negative for sore throat and trouble swallowing.   Respiratory: Negative for chest tightness and  shortness of breath.   Cardiovascular: Negative for chest pain.  Gastrointestinal: Positive for abdominal pain, diarrhea and vomiting (two episodes previously and excessive vomiting last night). Negative for abdominal distention, blood in stool, flatus, hematochezia and nausea.  Neurological: Negative for dizziness, light-headedness and headaches.    Patient Active Problem List   Diagnosis Date Noted  . Dietary B12 deficiency 06/20/2019  . Menopausal symptoms 10/11/2017  . Tobacco use disorder, mild, in sustained remission 09/30/2015  . Attention deficit disorder with hyperactivity 09/30/2015  . Asthma, mild intermittent 09/30/2015  . Acid reflux 09/30/2015  . Acne erythematosa 09/30/2015  . Arthralgia of temporomandibular joint 09/30/2015    Allergies  Allergen Reactions  . Augmentin [Amoxicillin-Pot Clavulanate] Other (See Comments)    Liver shuts down  . Meloxicam Shortness Of Breath  . Hydrocodone Rash  . Penicillins Rash    Past Surgical History:  Procedure Laterality Date  . ABDOMINAL HYSTERECTOMY  2011   endometrial dysplasia  . AUGMENTATION MAMMAPLASTY Bilateral 2010  . OOPHORECTOMY Right 2011  . TUBAL LIGATION      Social History   Tobacco Use  . Smoking status: Former Smoker    Quit date: 05/2017    Years since quitting: 2.1  . Smokeless tobacco: Never Used  Substance Use Topics  . Alcohol use: Yes    Alcohol/week: 2.0 standard drinks    Types: 2 Standard drinks or equivalent per week  . Drug use: Not on file     Medication list has been reviewed and updated.  Current Meds  Medication Sig  . albuterol (VENTOLIN HFA) 108 (90 Base)  MCG/ACT inhaler Inhale 2 puffs into the lungs every 6 (six) hours as needed for wheezing or shortness of breath.  . cimetidine (TAGAMET) 200 MG tablet Take 200 mg by mouth 2 (two) times daily.  . DULERA 200-5 MCG/ACT AERO INHALE 2 PUFFS INTO THE LUNGS DAILY.  . fexofenadine (ALLEGRA) 30 MG tablet Take 30 mg by mouth 2 (two)  times daily.  Marland Kitchen. triamcinolone cream (KENALOG) 0.1 % Apply 1 application topically 2 (two) times daily.  Marland Kitchen. venlafaxine XR (EFFEXOR XR) 37.5 MG 24 hr capsule Take 1 capsule (37.5 mg total) by mouth daily with breakfast.    PHQ 2/9 Scores 06/26/2019 05/10/2019 10/11/2017  PHQ - 2 Score 0 0 0  PHQ- 9 Score 3 9 -    BP Readings from Last 3 Encounters:  05/10/19 102/64  10/11/17 122/78  01/11/17 94/62    Physical Exam Constitutional:      General: She is not in acute distress. Pulmonary:     Effort: Pulmonary effort is normal.  Neurological:     Mental Status: She is alert and oriented to person, place, and time.     Wt Readings from Last 3 Encounters:  06/26/19 188 lb (85.3 kg)  05/10/19 188 lb (85.3 kg)  10/11/17 180 lb (81.6 kg)    Temp (!) 97.4 F (36.3 C) (Oral)   Ht 5\' 8"  (1.727 m)   Wt 188 lb (85.3 kg)   BMI 28.59 kg/m   Assessment and Plan: 1. Epigastric pain Pt does not sound toxic - in fact diarrhea has decreased somewhat Concern for gall bladder disease vs pancreatitis vs enteritis Resume cimetidine daily Bland diet Go to ED if sx are severe/persistent - US Abdomen Complete; Future  I spent 14 minutes on this encounter. Partially dictated using Animal nutritionistDragon software. Any errors are unintentional.  Bari EdwardLaura Chasty Randal, MD [facility]  06/26/2019

## 2019-06-29 ENCOUNTER — Other Ambulatory Visit: Payer: Self-pay

## 2019-06-29 ENCOUNTER — Ambulatory Visit
Admission: RE | Admit: 2019-06-29 | Discharge: 2019-06-29 | Disposition: A | Discharge status: Home / Self Care | Payer: BC Managed Care – PPO | Source: Ambulatory Visit | Attending: Internal Medicine | Admitting: Internal Medicine

## 2019-06-29 DIAGNOSIS — R1013 Epigastric pain: Secondary | ICD-10-CM

## 2019-06-29 NOTE — Progress Notes (Signed)
Patient said she is still having stomach pains and taking medication everyday. Wants to know next steps since [place] is normal.  Please advise.

## 2019-07-02 ENCOUNTER — Other Ambulatory Visit: Payer: Self-pay

## 2019-07-02 DIAGNOSIS — R1013 Epigastric pain: Secondary | ICD-10-CM

## 2019-07-02 NOTE — Progress Notes (Signed)
Placed referral for GI- Dr Allen Norris in [place].

## 2019-07-30 ENCOUNTER — Ambulatory Visit: Payer: BC Managed Care – PPO | Admitting: Gastroenterology

## 2019-08-29 ENCOUNTER — Ambulatory Visit: Payer: BC Managed Care – PPO | Admitting: Gastroenterology

## 2019-08-29 ENCOUNTER — Encounter: Payer: Self-pay | Admitting: *Deleted

## 2019-08-29 DIAGNOSIS — R1013 Epigastric pain: Secondary | ICD-10-CM

## 2019-10-11 ENCOUNTER — Other Ambulatory Visit: Payer: Self-pay | Admitting: Internal Medicine

## 2019-10-11 DIAGNOSIS — N951 Menopausal and female climacteric states: Secondary | ICD-10-CM

## 2019-10-15 ENCOUNTER — Other Ambulatory Visit: Payer: Self-pay

## 2019-10-15 ENCOUNTER — Ambulatory Visit: Payer: BC Managed Care – PPO | Admitting: Internal Medicine

## 2019-10-15 ENCOUNTER — Encounter: Payer: Self-pay | Admitting: Internal Medicine

## 2019-10-15 VITALS — BP 124/76 | HR 100 | Ht 68.0 in | Wt 186.0 lb

## 2019-10-15 DIAGNOSIS — A599 Trichomoniasis, unspecified: Secondary | ICD-10-CM | POA: Diagnosis not present

## 2019-10-15 DIAGNOSIS — B373 Candidiasis of vulva and vagina: Secondary | ICD-10-CM | POA: Diagnosis not present

## 2019-10-15 DIAGNOSIS — R3129 Other microscopic hematuria: Secondary | ICD-10-CM

## 2019-10-15 DIAGNOSIS — B3731 Acute candidiasis of vulva and vagina: Secondary | ICD-10-CM

## 2019-10-15 LAB — POC URINALYSIS WITH MICROSCOPIC (NON AUTO)MANUAL RESULT
Bilirubin, UA: NEGATIVE
Blood, UA: NEGATIVE
Crystals: 0
Glucose, UA: NEGATIVE
Ketones, UA: NEGATIVE
Mucus, UA: 0
Nitrite, UA: NEGATIVE
Protein, UA: NEGATIVE
RBC: 0 M/uL — AB (ref 4.04–5.48)
Spec Grav, UA: 1.015 (ref 1.010–1.025)
Urobilinogen, UA: 0.2 E.U./dL
WBC Casts, UA: 0
pH, UA: 6 (ref 5.0–8.0)

## 2019-10-15 MED ORDER — FLUCONAZOLE 100 MG PO TABS: 100.0000 mg | ORAL_TABLET | Freq: Every day | ORAL | 0 refills | Status: AC

## 2019-10-15 MED ORDER — METRONIDAZOLE 500 MG PO TABS: 500.0000 mg | ORAL_TABLET | Freq: Two times a day (BID) | ORAL | 0 refills | Status: AC

## 2019-10-15 NOTE — Progress Notes (Signed)
Date:  10/15/2019   Name:  Shannon Stanton   DOB:  [birth year]   MRN:  [id number]   Chief Complaint: Back Pain (Back pain- worse in Rt side than lft. When wiping sometimes she see's blood. Vagina itching inside her vaginal hole. Used hydrocortisone and its helped and has not itched in several days. ) and Depression (9-phq9, patient job went out of business so currently not working. Under a lot of stress. )  Hematuria This is a recurrent problem. The current episode started 1 to 4 weeks ago. She describes the hematuria as gross hematuria. She describes her urine color as light pink.  Vaginal Discharge The patient's primary symptoms include genital itching, vaginal bleeding and vaginal discharge. The patient's pertinent negatives include no genital rash. This is a new problem. The current episode started 1 to 4 weeks ago. The pain is mild. Associated symptoms include hematuria.    Lab Results  Component Value Date   CREATININE 1.10 (H) 05/10/2019   BUN 16 05/10/2019   NA 141 05/10/2019   K 4.6 05/10/2019   CL 102 05/10/2019   CO2 22 05/10/2019   Lab Results  Component Value Date   CHOL 207 (H) 05/10/2019   HDL 95 05/10/2019   LDLCALC 94 05/10/2019   TRIG 92 05/10/2019   CHOLHDL 2.2 05/10/2019   Lab Results  Component Value Date   TSH 1.510 05/10/2019   No results found for: HGBA1C   Review of Systems  Genitourinary: Positive for hematuria and vaginal discharge.    Patient Active Problem List   Diagnosis Date Noted  . Dietary B12 deficiency 06/20/2019  . Menopausal symptoms 10/11/2017  . Tobacco use disorder, mild, in sustained remission 09/30/2015  . Attention deficit disorder with hyperactivity 09/30/2015  . Asthma, mild intermittent 09/30/2015  . Acid reflux 09/30/2015  . Acne erythematosa 09/30/2015  . Arthralgia of temporomandibular joint 09/30/2015    Allergies  Allergen Reactions  . Augmentin [Amoxicillin-Pot Clavulanate] Other (See Comments)    Liver  shuts down  . Meloxicam Shortness Of Breath  . Hydrocodone Rash  . Penicillins Rash    Past Surgical History:  Procedure Laterality Date  . ABDOMINAL HYSTERECTOMY  2011   endometrial dysplasia  . AUGMENTATION MAMMAPLASTY Bilateral 2010  . OOPHORECTOMY Right 2011  . TUBAL LIGATION      Social History   Tobacco Use  . Smoking status: Former Smoker    Quit date: 05/2017    Years since quitting: 2.4  . Smokeless tobacco: Never Used  Substance Use Topics  . Alcohol use: Yes    Alcohol/week: 2.0 standard drinks    Types: 2 Standard drinks or equivalent per week  . Drug use: Not on file     Medication list has been reviewed and updated.  Current Meds  Medication Sig  . albuterol (VENTOLIN HFA) 108 (90 Base) MCG/ACT inhaler Inhale 2 puffs into the lungs every 6 (six) hours as needed for wheezing or shortness of breath.  . DULERA 200-5 MCG/ACT AERO INHALE 2 PUFFS INTO THE LUNGS DAILY.  . fexofenadine (ALLEGRA) 30 MG tablet Take 30 mg by mouth 2 (two) times daily.  Marland Kitchen triamcinolone cream (KENALOG) 0.1 % Apply 1 application topically 2 (two) times daily.  Marland Kitchen venlafaxine XR (EFFEXOR-XR) 37.5 MG 24 hr capsule TAKE 1 CAPSULE (37.5 MG TOTAL) BY MOUTH DAILY WITH BREAKFAST.    PHQ 2/9 Scores 10/15/2019 06/26/2019 05/10/2019 10/11/2017  PHQ - 2 Score 2 0 0 0  PHQ- 9  Score 9 3 9  -    BP Readings from Last 3 Encounters:  10/15/19 124/76  05/10/19 102/64  10/11/17 122/78    Physical Exam Vitals and nursing note reviewed.  Constitutional:      General: She is not in acute distress.    Appearance: Normal appearance. She is well-developed.  HENT:     Head: Normocephalic and atraumatic.  Cardiovascular:     Rate and Rhythm: Normal rate and regular rhythm.     Pulses: Normal pulses.  Pulmonary:     Effort: Pulmonary effort is normal. No respiratory distress.     Breath sounds: No wheezing or rhonchi.  Abdominal:     General: Abdomen is flat. Bowel sounds are normal.      Palpations: Abdomen is soft.     Tenderness: There is no abdominal tenderness. There is no right CVA tenderness or left CVA tenderness.     Hernia: No hernia is present.  Musculoskeletal:        General: Normal range of motion.     Right lower leg: No edema.     Left lower leg: No edema.  Skin:    General: Skin is warm and dry.     Findings: No rash.  Neurological:     Mental Status: She is alert and oriented to person, place, and time.  Psychiatric:        Behavior: Behavior normal.        Thought Content: Thought content normal.     Wt Readings from Last 3 Encounters:  10/15/19 186 lb (84.4 kg)  06/26/19 188 lb (85.3 kg)  05/10/19 188 lb (85.3 kg)    BP 124/76   Pulse 100   Ht 5\' 8"  (1.727 m)   Wt 186 lb (84.4 kg)   SpO2 96%   BMI 28.28 kg/m   Assessment and Plan: 1. Other microscopic hematuria UA negative for blood Suspect vaginal irritation as the source of intermittent bleeding Pt advised to stop use of cortisone cream vaginally - POC urinalysis w microscopic (non auto)  2. Trichomonas vaginalis infection Has not been sexually active in > 6 mo with a single partner Question partner regarding symptoms as he may need to seek treatment - metroNIDAZOLE (FLAGYL) 500 MG tablet; Take 1 tablet (500 mg total) by mouth 2 (two) times daily for 7 days.  Dispense: 14 tablet; Refill: 0  3. Yeast vaginitis Budding yeast seen on UA micro May use Monistat cream if helpful but discontinue use of cortisone topically - fluconazole (DIFLUCAN) 100 MG tablet; Take 1 tablet (100 mg total) by mouth daily for 7 days.  Dispense: 7 tablet; Refill: 0   Partially dictated using 07/11/19. Any errors are unintentional.  , MD [facility]  10/15/2019

## 2019-10-15 NOTE — Patient Instructions (Signed)
Trichomoniasis Trichomoniasis is an STI (sexually transmitted infection) that can affect both women and men. In women, the outer area of the female genitalia (vulva) and the vagina are affected. In men, mainly the penis is affected, but the prostate and other reproductive organs can also be involved.  This condition can be treated with medicine. It often has no symptoms (is asymptomatic), especially in men. If not treated, trichomoniasis can last for months or years. What are the causes? This condition is caused by a parasite called Trichomonas vaginalis. Trichomoniasis most often spreads from person to person (is contagious) through sexual contact. What increases the risk? The following factors may make you more likely to develop this condition:  Having unprotected sex.  Having sex with a partner who has trichomoniasis.  Having multiple sexual partners.  Having had previous trichomoniasis infections or other STIs. What are the signs or symptoms? In women, symptoms of trichomoniasis include:  Abnormal vaginal discharge that is clear, white, gray, or yellow-green and foamy and has an unusual "fishy" odor.  Itching and irritation of the vagina and vulva.  Burning or pain during urination or sex.  Redness and swelling of the genitals. In men, symptoms of trichomoniasis include:  Penile discharge that may be foamy or contain pus.  Pain in the penis. This may happen only when urinating.  Itching or irritation inside the penis.  Burning after urination or ejaculation. How is this diagnosed? In women, this condition may be found during a routine Pap test or physical exam. It may be found in men during a routine physical exam. Your health care provider may do tests to help diagnose this infection, such as:  Urine tests (men and women).  The following in women: ? Testing the pH of the vagina. ? A vaginal swab test that checks for the Trichomonas vaginalis parasite. ? Testing vaginal  secretions. Your health care provider may test you for other STIs, including HIV (human immunodeficiency virus). How is this treated? This condition is treated with medicine taken by mouth (orally), such as metronidazole or tinidazole, to fight the infection. Your sexual partner(s) also need to be tested and treated.  If you are a woman and you plan to become pregnant or think you may be pregnant, tell your health care provider right away. Some medicines that are used to treat the infection should not be taken during pregnancy. Your health care provider may recommend over-the-counter medicines or creams to help relieve itching or irritation. You may be tested for infection again 3 months after treatment. Follow these instructions at home:  Take and use over-the-counter and prescription medicines, including creams, only as told by your health care provider.  Take your antibiotic medicine as told by your health care provider. Do not stop taking the antibiotic even if you start to feel better.  Do not have sex until 7-10 days after you finish your medicine, or until your health care provider approves. Ask your health care provider when you may start to have sex again.  (Women) Do not douche or wear tampons while you have the infection.  Discuss your infection with your sexual partner(s). Make sure that your partner gets tested and treated, if necessary.  Keep all follow-up visits as told by your health care provider. This is important. How is this prevented?   Use condoms every time you have sex. Using condoms correctly and consistently can help protect against STIs.  Avoid having multiple sexual partners.  Talk with your sexual partner about any   symptoms that either of you may have, as well as any history of STIs.  Get tested for STIs and STDs (sexually transmitted diseases) before you have sex. Ask your partner to do the same.  Do not have sexual contact if you have symptoms of  trichomoniasis or another STI. Contact a health care provider if:  You still have symptoms after you finish your medicine.  You develop pain in your abdomen.  You have pain when you urinate.  You have bleeding after sex.  You develop a rash.  You feel nauseous or you vomit.  You plan to become pregnant or think you may be pregnant. Summary  Trichomoniasis is an STI (sexually transmitted infection) that can affect both women and men.  This condition often has no symptoms (is asymptomatic), especially in men.  Without treatment, this condition can last for months or years.  You should not have sex until 7-10 days after you finish your medicine, or until your health care provider approves. Ask your health care provider when you may start to have sex again.  Discuss your infection with your sexual partner(s). Make sure that your partner gets tested and treated, if necessary. This information is not intended to replace advice given to you by your health care provider. Make sure you discuss any questions you have with your health care provider. Document Released: 04/13/2001 Document Revised: 08/01/2018 Document Reviewed: 08/01/2018 Elsevier Patient Education  2020 Elsevier Inc.  

## 2019-10-30 ENCOUNTER — Other Ambulatory Visit: Payer: Self-pay | Admitting: Internal Medicine

## 2019-10-30 DIAGNOSIS — J4520 Mild intermittent asthma, uncomplicated: Secondary | ICD-10-CM

## 2020-02-01 ENCOUNTER — Other Ambulatory Visit: Payer: Self-pay | Admitting: Internal Medicine

## 2020-02-01 DIAGNOSIS — N951 Menopausal and female climacteric states: Secondary | ICD-10-CM

## 2020-05-15 ENCOUNTER — Encounter: Payer: BC Managed Care – PPO | Admitting: Internal Medicine

## 2020-08-07 ENCOUNTER — Encounter: Payer: Self-pay | Admitting: Internal Medicine

## 2020-08-07 ENCOUNTER — Other Ambulatory Visit: Payer: Self-pay

## 2020-08-07 ENCOUNTER — Ambulatory Visit (INDEPENDENT_AMBULATORY_CARE_PROVIDER_SITE_OTHER): Payer: Self-pay | Admitting: Internal Medicine

## 2020-08-07 VITALS — BP 102/62 | HR 77 | Temp 97.9°F | Ht 68.0 in | Wt 162.0 lb

## 2020-08-07 DIAGNOSIS — J019 Acute sinusitis, unspecified: Secondary | ICD-10-CM

## 2020-08-07 DIAGNOSIS — H109 Unspecified conjunctivitis: Secondary | ICD-10-CM

## 2020-08-07 MED ORDER — AZITHROMYCIN 250 MG PO TABS: ORAL_TABLET | ORAL | 0 refills | Status: AC

## 2020-08-07 MED ORDER — NEOMYCIN-POLYMYXIN-DEXAMETH 3.5-10000-0.1 OP SUSP: 2.0000 [drp] | Freq: Four times a day (QID) | OPHTHALMIC | 0 refills | Status: AC

## 2020-08-07 NOTE — Progress Notes (Signed)
Date:  08/07/2020   Name:  Shannon Stanton   DOB:  [birth year]   MRN:  [id number]   Chief Complaint: Ear Pain (Right ear pain- goes into neck and right shoulder. Now her left ear is hurting. Chills all day, and fatigued and back pain. Had Covid at the start of September - Been feeling this way for about 2 weeks. )  Otalgia  There is pain in both ears. This is a new problem. The current episode started 1 to 4 weeks ago. The problem occurs constantly. The problem has been gradually worsening. There has been no fever. Associated symptoms include neck pain and a sore throat. Pertinent negatives include no coughing, diarrhea, ear discharge or headaches.   Immunization History  Administered Date(s) Administered  . Tdap 05/10/2019     Lab Results  Component Value Date   CREATININE 1.10 (H) 05/10/2019   BUN 16 05/10/2019   NA 141 05/10/2019   K 4.6 05/10/2019   CL 102 05/10/2019   CO2 22 05/10/2019   Lab Results  Component Value Date   CHOL 207 (H) 05/10/2019   HDL 95 05/10/2019   LDLCALC 94 05/10/2019   TRIG 92 05/10/2019   CHOLHDL 2.2 05/10/2019   Lab Results  Component Value Date   TSH 1.510 05/10/2019   No results found for: HGBA1C Lab Results  Component Value Date   WBC 5.6 05/10/2019   HGB 14.8 05/10/2019   HCT 43.2 05/10/2019   MCV 91 05/10/2019   PLT 180 05/10/2019   Lab Results  Component Value Date   ALT 10 05/10/2019   AST 18 05/10/2019   ALKPHOS 78 05/10/2019   BILITOT 1.1 05/10/2019     Review of Systems  Constitutional: Positive for chills and fatigue. Negative for fever.  HENT: Positive for ear pain, sinus pressure and sore throat. Negative for ear discharge.   Respiratory: Negative for cough, chest tightness, shortness of breath and wheezing.   Cardiovascular: Negative for chest pain.  Gastrointestinal: Negative for diarrhea.  Musculoskeletal: Positive for neck pain.  Neurological: Positive for dizziness. Negative for light-headedness and  headaches.    Patient Active Problem List   Diagnosis Date Noted  . Dietary B12 deficiency 06/20/2019  . Menopausal symptoms 10/11/2017  . Tobacco use disorder, mild, in sustained remission 09/30/2015  . Attention deficit disorder with hyperactivity 09/30/2015  . Asthma, mild intermittent 09/30/2015  . Acid reflux 09/30/2015  . Acne erythematosa 09/30/2015  . Arthralgia of temporomandibular joint 09/30/2015    Allergies  Allergen Reactions  . Augmentin [Amoxicillin-Pot Clavulanate] Other (See Comments)    Liver shuts down  . Meloxicam Shortness Of Breath  . Hydrocodone Rash  . Penicillins Rash    Past Surgical History:  Procedure Laterality Date  . ABDOMINAL HYSTERECTOMY  2011   endometrial dysplasia  . AUGMENTATION MAMMAPLASTY Bilateral 2010  . OOPHORECTOMY Right 2011  . TUBAL LIGATION      Social History   Tobacco Use  . Smoking status: Former Smoker    Quit date: 05/2017    Years since quitting: 3.2  . Smokeless tobacco: Never Used  Vaping Use  . Vaping Use: Never used  Substance Use Topics  . Alcohol use: Yes    Alcohol/week: 2.0 standard drinks    Types: 2 Standard drinks or equivalent per week  . Drug use: Not on file     Medication list has been reviewed and updated.  Current Meds  Medication Sig  . DULERA 200-5 MCG/ACT  AERO INHALE 2 PUFFS INTO THE LUNGS DAILY.  . fexofenadine (ALLEGRA) 30 MG tablet Take 30 mg by mouth 2 (two) times daily.    PHQ 2/9 Scores 08/07/2020 10/15/2019 06/26/2019 05/10/2019  PHQ - 2 Score 4 2 0 0  PHQ- 9 Score 10 9 3 9     GAD 7 : Generalized Anxiety Score 08/07/2020  Nervous, Anxious, on Edge 2  Control/stop worrying 2  Worry too much - different things 2  Trouble relaxing 0  Restless 0  Easily annoyed or irritable 0  Afraid - awful might happen 0  Total GAD 7 Score 6  Anxiety Difficulty Not difficult at all    BP Readings from Last 3 Encounters:  08/07/20 102/62  10/15/19 124/76  05/10/19 102/64    Physical  Exam Constitutional:      Appearance: She is well-developed.  HENT:     Right Ear: Ear canal and external ear normal. Tympanic membrane is not erythematous or retracted.     Left Ear: Ear canal and external ear normal. Tympanic membrane is not erythematous or retracted.     Nose:     Right Sinus: Maxillary sinus tenderness present. No frontal sinus tenderness.     Left Sinus: Maxillary sinus tenderness present. No frontal sinus tenderness.     Mouth/Throat:     Mouth: No oral lesions.     Pharynx: Uvula midline. No oropharyngeal exudate or posterior oropharyngeal erythema.  Eyes:     General:        Right eye: Discharge present.        Left eye: Discharge present. Cardiovascular:     Rate and Rhythm: Normal rate and regular rhythm.     Heart sounds: Normal heart sounds.  Pulmonary:     Effort: Pulmonary effort is normal.     Breath sounds: Normal breath sounds. No wheezing or rales.  Musculoskeletal:     Right lower leg: No edema.     Left lower leg: No edema.  Lymphadenopathy:     Cervical: No cervical adenopathy.  Skin:    Capillary Refill: Capillary refill takes less than 2 seconds.  Neurological:     General: No focal deficit present.     Mental Status: She is alert and oriented to person, place, and time.     Wt Readings from Last 3 Encounters:  08/07/20 162 lb (73.5 kg)  10/15/19 186 lb (84.4 kg)  06/26/19 188 lb (85.3 kg)    BP 102/62   Pulse 77   Temp 97.9 F (36.6 C) (Oral)   Ht 5\' 8"  (1.727 m)   Wt 162 lb (73.5 kg)   SpO2 95%   BMI 24.63 kg/m   Assessment and Plan: 1. Acute non-recurrent sinusitis, unspecified location Continue claritin and sudafed; fluids, rest - azithromycin (ZITHROMAX Z-PAK) 250 MG tablet; UAD  Dispense: 6 each; Refill: 0  2. Conjunctivitis of both eyes, unspecified conjunctivitis type - neomycin-polymyxin b-dexamethasone (MAXITROL) 3.5-10000-0.1 SUSP; Place 2 drops into both eyes every 6 (six) hours for 10 days.  Dispense: 5 mL;  Refill: 0   Partially dictated using . Any errors are unintentional.  06-27-2003, MD [facility]  08/07/2020

## 2020-09-03 ENCOUNTER — Other Ambulatory Visit: Payer: Self-pay | Admitting: Internal Medicine

## 2020-09-03 DIAGNOSIS — J4520 Mild intermittent asthma, uncomplicated: Secondary | ICD-10-CM

## 2020-12-02 ENCOUNTER — Other Ambulatory Visit: Payer: Self-pay | Admitting: Internal Medicine

## 2020-12-02 DIAGNOSIS — J4520 Mild intermittent asthma, uncomplicated: Secondary | ICD-10-CM

## 2020-12-10 DIAGNOSIS — Z90710 Acquired absence of both cervix and uterus: Secondary | ICD-10-CM | POA: Insufficient documentation

## 2020-12-11 ENCOUNTER — Telehealth: Payer: Self-pay

## 2020-12-11 NOTE — Telephone Encounter (Signed)
Spoke to pt about insurance that was on file to be able to complete PA. 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 Will try to send her new insurance card thru Mychart.  KP

## 2020-12-29 ENCOUNTER — Telehealth: Payer: Self-pay

## 2020-12-29 NOTE — Telephone Encounter (Signed)
PA completed waiting for insurance approval.  Key: [id number]  KP

## 2020-12-30 NOTE — Telephone Encounter (Signed)
Insurance denied coverage.  KP 

## 2021-01-01 ENCOUNTER — Telehealth: Payer: Self-pay

## 2021-01-01 ENCOUNTER — Other Ambulatory Visit: Payer: Self-pay | Admitting: Internal Medicine

## 2021-01-01 DIAGNOSIS — J4520 Mild intermittent asthma, uncomplicated: Secondary | ICD-10-CM

## 2021-01-01 MED ORDER — FLUTICASONE-SALMETEROL 250-50 MCG/DOSE IN AEPB: 1.0000 | INHALATION_SPRAY | Freq: Two times a day (BID) | RESPIRATORY_TRACT | 5 refills | Status: DC

## 2021-01-01 NOTE — Telephone Encounter (Signed)
Called pt left VM to call back. Pts insurance denied coverage for [facility] inhaler. Pts insurance will cover Wixela. Dr. Judithann Graves sent in a Wixela inhaler. If pt wants to continue to take [facility] pt will have to pay out of pocket.  Will route result note to [facility] Nurse Triage for follow up when patient returns call to clinic. Nurse may give results to patient if they return call. CRM created for this message.    KP

## 2021-01-02 NOTE — Telephone Encounter (Signed)
Patient called, no answer, no mailbox set up. 

## 2021-01-05 NOTE — Telephone Encounter (Signed)
Attempted to call patient no voicemail set up unable to leave message.

## 2021-09-23 ENCOUNTER — Other Ambulatory Visit: Payer: Self-pay

## 2021-09-23 ENCOUNTER — Encounter: Payer: Self-pay | Admitting: Internal Medicine

## 2021-09-23 ENCOUNTER — Ambulatory Visit: Payer: BLUE CROSS/BLUE SHIELD | Admitting: Internal Medicine

## 2021-09-23 VITALS — BP 122/64 | HR 67 | Temp 98.1°F | Ht 68.0 in | Wt 189.6 lb

## 2021-09-23 DIAGNOSIS — J019 Acute sinusitis, unspecified: Secondary | ICD-10-CM

## 2021-09-23 DIAGNOSIS — F331 Major depressive disorder, recurrent, moderate: Secondary | ICD-10-CM | POA: Insufficient documentation

## 2021-09-23 MED ORDER — CEFDINIR 300 MG PO CAPS: 300.0000 mg | ORAL_CAPSULE | Freq: Two times a day (BID) | ORAL | 0 refills | Status: AC

## 2021-09-23 MED ORDER — BUPROPION HCL ER (XL) 150 MG PO TB24: 150.0000 mg | ORAL_TABLET | Freq: Every day | ORAL | 1 refills | Status: DC

## 2021-09-23 NOTE — Progress Notes (Signed)
Date:  09/23/2021   Name:  Shannon Stanton   DOB:  [birth year]   MRN:  [id number]   Chief Complaint: Sinusitis  Sinusitis This is a new problem. The current episode started more than 1 month ago. The pain is mild. Associated symptoms include congestion, coughing, headaches, sinus pressure and a sore throat. Pertinent negatives include no chills or shortness of breath. Treatments tried: mucinex, claritin, advil.  Depression        This is a recurrent problem.  The onset quality is undetermined.   The problem occurs daily.  The problem has been gradually worsening since onset.  Associated symptoms include irritable, decreased interest, headaches and sad.  Associated symptoms include no fatigue.     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  Treatments tried: effexor in the past, also bupropion.Patient took a Covid test and it was negative.  Lab Results  Component Value Date   CREATININE 1.10 (H) 05/10/2019   BUN 16 05/10/2019   NA 141 05/10/2019   K 4.6 05/10/2019   CL 102 05/10/2019   CO2 22 05/10/2019   Lab Results  Component Value Date   CHOL 207 (H) 05/10/2019   HDL 95 05/10/2019   LDLCALC 94 05/10/2019   TRIG 92 05/10/2019   CHOLHDL 2.2 05/10/2019   Lab Results  Component Value Date   TSH 1.510 05/10/2019   No results found for: HGBA1C Lab Results  Component Value Date   WBC 5.6 05/10/2019   HGB 14.8 05/10/2019   HCT 43.2 05/10/2019   MCV 91 05/10/2019   PLT 180 05/10/2019   Lab Results  Component Value Date   ALT 10 05/10/2019   AST 18 05/10/2019   ALKPHOS 78 05/10/2019   BILITOT 1.1 05/10/2019   No components found for: VITD  Review of Systems  Constitutional:  Negative for chills, fatigue and fever.  HENT:  Positive for congestion, sinus pressure and sore throat. Negative for trouble swallowing.   Respiratory:  Positive for cough. Negative for chest tightness, shortness of breath and wheezing.    Cardiovascular:  Negative for chest pain and palpitations.  Neurological:  Positive for headaches.  Psychiatric/Behavioral:  Positive for depression. Negative for sleep disturbance. The patient is not nervous/anxious.    Patient Active Problem List   Diagnosis Date Noted   Status post hysterectomy 12/10/2020   Dietary B12 deficiency 06/20/2019   Menopausal symptoms 10/11/2017   Tobacco use disorder, mild, in sustained remission 09/30/2015   Attention deficit disorder with hyperactivity 09/30/2015   Asthma, mild intermittent 09/30/2015   Acid reflux 09/30/2015   Acne erythematosa 09/30/2015   Arthralgia of temporomandibular joint 09/30/2015    Allergies  Allergen Reactions   Augmentin [Amoxicillin-Pot Clavulanate] Other (See Comments)    Liver shuts down   Meloxicam Shortness Of Breath   Hydrocodone Rash   Penicillins Rash    Past Surgical History:  Procedure Laterality Date   ABDOMINAL HYSTERECTOMY  2011   endometrial dysplasia   AUGMENTATION MAMMAPLASTY Bilateral 2010   OOPHORECTOMY Right 2011   TUBAL LIGATION      Social History   Tobacco Use   Smoking status: Former    Types: Cigarettes    Quit date: 05/2017    Years since quitting: 4.4   Smokeless tobacco: Never  Vaping Use   Vaping Use: Never used  Substance Use Topics   Alcohol use: Yes    Alcohol/week: 2.0 standard drinks  Types: 2 Standard drinks or equivalent per week     Medication list has been reviewed and updated.  Current Meds  Medication Sig   albuterol (VENTOLIN HFA) 108 (90 Base) MCG/ACT inhaler TAKE 2 PUFFS BY MOUTH EVERY 6 HOURS AS NEEDED FOR WHEEZE OR SHORTNESS OF BREATH   fexofenadine (ALLEGRA) 30 MG tablet Take 30 mg by mouth 2 (two) times daily.   Fluticasone-Salmeterol (WIXELA INHUB) 250-50 MCG/DOSE AEPB Inhale 1 puff into the lungs in the morning and at bedtime.    PHQ 2/9 Scores 09/23/2021 08/07/2020 10/15/2019 06/26/2019  PHQ - 2 Score 4 4 2  0  PHQ- 9 Score 13 10 9 3      GAD 7 : Generalized Anxiety Score 09/23/2021 08/07/2020  Nervous, Anxious, on Edge 0 2  Control/stop worrying 1 2  Worry too much - different things 0 2  Trouble relaxing 0 0  Restless 0 0  Easily annoyed or irritable 1 0  Afraid - awful might happen 0 0  Total GAD 7 Score 2 6  Anxiety Difficulty Somewhat difficult Not difficult at all    BP Readings from Last 3 Encounters:  09/23/21 122/64  08/07/20 102/62  10/15/19 124/76    Physical Exam Constitutional:      General: She is irritable.     Appearance: She is well-developed.  HENT:     Right Ear: Ear canal and external ear normal. Tympanic membrane is retracted. Tympanic membrane is not erythematous.     Left Ear: Ear canal and external ear normal. Tympanic membrane is retracted. Tympanic membrane is not erythematous.     Nose:     Right Sinus: Maxillary sinus tenderness present. No frontal sinus tenderness.     Left Sinus: Maxillary sinus tenderness present. No frontal sinus tenderness.     Mouth/Throat:     Mouth: No oral lesions.     Pharynx: Uvula midline. Posterior oropharyngeal erythema present. No oropharyngeal exudate.  Cardiovascular:     Rate and Rhythm: Normal rate and regular rhythm.     Heart sounds: Normal heart sounds.  Pulmonary:     Effort: Pulmonary effort is normal.     Breath sounds: Normal breath sounds. Transmitted upper airway sounds present. No wheezing, rhonchi or rales.  Lymphadenopathy:     Cervical: No cervical adenopathy.  Neurological:     Mental Status: She is alert and oriented to person, place, and time.  Psychiatric:        Attention and Perception: Attention normal.        Mood and Affect: Affect is tearful.        Speech: Speech normal.        Behavior: Behavior normal.        Thought Content: Thought content does not include suicidal plan.        Cognition and Memory: Cognition normal.        Judgment: Judgment normal.    Wt Readings from Last 3 Encounters:  09/23/21 189  lb 9.6 oz (86 kg)  08/07/20 162 lb (73.5 kg)  10/15/19 186 lb (84.4 kg)    BP 122/64   Pulse 67   Temp 98.1 F (36.7 C) (Oral)   Ht 5\' 8"  (1.727 m)   Wt 189 lb 9.6 oz (86 kg)   SpO2 98%   BMI 28.83 kg/m   Assessment and Plan: 1. Acute non-recurrent sinusitis, unspecified location Continue Allegra, fluids, voice rest Follow up if needed - cefdinir (OMNICEF) 300 MG capsule; Take 1 capsule (300  mg total) by mouth 2 (two) times daily for 10 days.  Dispense: 20 capsule; Refill: 0  2. Moderate episode of recurrent major depressive disorder (HCC) Worsening recently due to situation with her mother. Will start Wellbutrin and follow up in one month for dose adjustment. - buPROPion (WELLBUTRIN XL) 150 MG 24 hr tablet; Take 1 tablet (150 mg total) by mouth daily.  Dispense: 30 tablet; Refill: 1   Partially dictated using Animal nutritionist. Any errors are unintentional.  Bari Edward, MD [facility]  09/23/2021

## 2021-10-23 ENCOUNTER — Other Ambulatory Visit: Payer: Self-pay

## 2021-10-23 ENCOUNTER — Ambulatory Visit: Payer: BLUE CROSS/BLUE SHIELD | Admitting: Internal Medicine

## 2021-10-23 ENCOUNTER — Encounter: Payer: Self-pay | Admitting: Internal Medicine

## 2021-10-23 VITALS — BP 110/74 | HR 92 | Ht 68.0 in | Wt 192.8 lb

## 2021-10-23 DIAGNOSIS — F331 Major depressive disorder, recurrent, moderate: Secondary | ICD-10-CM

## 2021-10-23 DIAGNOSIS — H9202 Otalgia, left ear: Secondary | ICD-10-CM

## 2021-10-23 NOTE — Progress Notes (Signed)
Date:  10/23/2021   Name:  Shannon Stanton   DOB:  [birth year]   MRN:  [id number]   Chief Complaint: No chief complaint on file.  Depression        This is a chronic problem.  The problem occurs intermittently.  The problem has been rapidly improving since onset.  Associated symptoms include no fatigue, no helplessness, no hopelessness, no appetite change, no headaches and no suicidal ideas.     The symptoms are aggravated by family issues.  Past treatments include other medications (bupropion started last month).  Compliance with treatment is good.  Previous treatment provided significant relief. Otalgia  There is pain in the left ear. This is a recurrent (itching in left ear) problem. The problem has been unchanged. There has been no fever. Associated symptoms include ear discharge. Pertinent negatives include no diarrhea, headaches or vomiting.    Lab Results  Component Value Date   NA 141 05/10/2019   K 4.6 05/10/2019   CO2 22 05/10/2019   GLUCOSE 91 05/10/2019   BUN 16 05/10/2019   CREATININE 1.10 (H) 05/10/2019   CALCIUM 9.6 05/10/2019   GFRNONAA 58 (L) 05/10/2019   Lab Results  Component Value Date   CHOL 207 (H) 05/10/2019   HDL 95 05/10/2019   LDLCALC 94 05/10/2019   TRIG 92 05/10/2019   CHOLHDL 2.2 05/10/2019   Lab Results  Component Value Date   TSH 1.510 05/10/2019   No results found for: HGBA1C Lab Results  Component Value Date   WBC 5.6 05/10/2019   HGB 14.8 05/10/2019   HCT 43.2 05/10/2019   MCV 91 05/10/2019   PLT 180 05/10/2019   Lab Results  Component Value Date   ALT 10 05/10/2019   AST 18 05/10/2019   ALKPHOS 78 05/10/2019   BILITOT 1.1 05/10/2019   No results found for: 25OHVITD2, 25OHVITD3, VD25OH   Review of Systems  Constitutional:  Negative for appetite change, fatigue and fever.  HENT:  Positive for ear discharge, ear pain and postnasal drip. Negative for trouble swallowing.   Respiratory:  Negative for chest tightness, shortness  of breath and wheezing.   Cardiovascular:  Negative for chest pain and palpitations.  Gastrointestinal:  Negative for abdominal distention, diarrhea, nausea and vomiting.  Allergic/Immunologic: Positive for environmental allergies.  Neurological:  Negative for headaches.  Psychiatric/Behavioral:  Positive for depression. Negative for dysphoric mood, sleep disturbance and suicidal ideas. The patient is not nervous/anxious.    Patient Active Problem List   Diagnosis Date Noted   Moderate episode of recurrent major depressive disorder (HCC) 09/23/2021   Status post hysterectomy 12/10/2020   Dietary B12 deficiency 06/20/2019   Menopausal symptoms 10/11/2017   Tobacco use disorder, mild, in sustained remission 09/30/2015   Attention deficit disorder with hyperactivity 09/30/2015   Asthma, mild intermittent 09/30/2015   Acid reflux 09/30/2015   Acne erythematosa 09/30/2015   Arthralgia of temporomandibular joint 09/30/2015    Allergies  Allergen Reactions   Augmentin [Amoxicillin-Pot Clavulanate] Other (See Comments)    Liver shuts down   Meloxicam Shortness Of Breath   Hydrocodone Rash   Penicillins Rash    Past Surgical History:  Procedure Laterality Date   ABDOMINAL HYSTERECTOMY  2011   endometrial dysplasia   AUGMENTATION MAMMAPLASTY Bilateral 2010   OOPHORECTOMY Right 2011   TUBAL LIGATION      Social History   Tobacco Use   Smoking status: Former    Types: Cigarettes    Quit date:  05/2017    Years since quitting: 4.4   Smokeless tobacco: Never  Vaping Use   Vaping Use: Never used  Substance Use Topics   Alcohol use: Yes    Alcohol/week: 2.0 standard drinks    Types: 2 Standard drinks or equivalent per week     Medication list has been reviewed and updated.  Current Meds  Medication Sig   albuterol (VENTOLIN HFA) 108 (90 Base) MCG/ACT inhaler TAKE 2 PUFFS BY MOUTH EVERY 6 HOURS AS NEEDED FOR WHEEZE OR SHORTNESS OF BREATH   buPROPion (WELLBUTRIN XL) 150 MG  24 hr tablet Take 1 tablet (150 mg total) by mouth daily.   DULERA 200-5 MCG/ACT AERO Inhale 2 puffs into the lungs 2 (two) times daily.   fexofenadine (ALLEGRA) 30 MG tablet Take 30 mg by mouth 2 (two) times daily.    PHQ 2/9 Scores 10/23/2021 09/23/2021 08/07/2020 10/15/2019  PHQ - 2 Score 0 4 4 2   PHQ- 9 Score 1 13 10 9     GAD 7 : Generalized Anxiety Score 10/23/2021 09/23/2021 08/07/2020  Nervous, Anxious, on Edge 0 0 2  Control/stop worrying 0 1 2  Worry too much - different things 0 0 2  Trouble relaxing 0 0 0  Restless 0 0 0  Easily annoyed or irritable 0 1 0  Afraid - awful might happen 0 0 0  Total GAD 7 Score 0 2 6  Anxiety Difficulty Not difficult at all Somewhat difficult Not difficult at all    BP Readings from Last 3 Encounters:  10/23/21 110/74  09/23/21 122/64  08/07/20 102/62    Physical Exam Vitals and nursing note reviewed.  Constitutional:      General: She is not in acute distress.    Appearance: Normal appearance. She is well-developed.  HENT:     Head: Normocephalic and atraumatic.     Right Ear: Tympanic membrane and ear canal normal.     Left Ear: Tympanic membrane normal.     Ears:     Comments: Dry skin in left ear canal Cardiovascular:     Rate and Rhythm: Normal rate and regular rhythm.     Pulses: Normal pulses.     Heart sounds: No murmur heard. Pulmonary:     Effort: Pulmonary effort is normal. No respiratory distress.     Breath sounds: No wheezing or rhonchi.  Musculoskeletal:        General: Normal range of motion.     Cervical back: Normal range of motion.  Lymphadenopathy:     Cervical: No cervical adenopathy.  Skin:    General: Skin is warm and dry.     Findings: No rash.  Neurological:     Mental Status: She is alert and oriented to person, place, and time.  Psychiatric:        Attention and Perception: Attention normal.        Mood and Affect: Mood normal.        Speech: Speech normal.        Behavior: Behavior normal.         Thought Content: Thought content does not include suicidal ideation. Thought content does not include suicidal plan.    Wt Readings from Last 3 Encounters:  10/23/21 192 lb 12.8 oz (87.5 kg)  09/23/21 189 lb 9.6 oz (86 kg)  08/07/20 162 lb (73.5 kg)    BP 110/74    Pulse 92    Ht 5\' 8"  (1.727 m)    Wt 192  lb 12.8 oz (87.5 kg)    SpO2 97%    BMI 29.32 kg/m   Assessment and Plan: 1. Moderate episode of recurrent major depressive disorder (HCC) Depression much improved with Bupropion. No SI/HI, no medication side effects. Continue current medication and dose.  2. Left ear pain Mild eczematous/dry skin changes Can use cortisone cream to the distal canal daily as needed to control sx   Partially dictated using Animal nutritionist. Any errors are unintentional.  Bari Edward, MD [facility]  10/23/2021

## 2021-11-18 ENCOUNTER — Other Ambulatory Visit: Payer: Self-pay | Admitting: Internal Medicine

## 2021-11-18 DIAGNOSIS — F331 Major depressive disorder, recurrent, moderate: Secondary | ICD-10-CM

## 2021-11-18 NOTE — Telephone Encounter (Signed)
Requested Prescriptions  Pending Prescriptions Disp Refills   buPROPion (WELLBUTRIN XL) 150 MG 24 hr tablet [Pharmacy Med Name: BUPROPION HCL XL 150 MG TABLET] 30 tablet 1    Sig: TAKE 1 TABLET BY MOUTH EVERY DAY     Psychiatry: Antidepressants - bupropion Passed - 11/18/2021  1:26 AM      Passed - Completed PHQ-2 or PHQ-9 in the last 360 days      Passed - Last BP in normal range    BP Readings from Last 1 Encounters:  10/23/21 110/74         Passed - Valid encounter within last 6 months    Recent Outpatient Visits          3 weeks ago Moderate episode of recurrent major depressive disorder [facility])   [facility] Glean Hess, MD   1 month ago Acute non-recurrent sinusitis, unspecified location   [place], [place]   1 year ago Acute non-recurrent sinusitis, unspecified location   [facility] Glean Hess, MD   2 years ago Other microscopic hematuria   [facility] Glean Hess, MD   2 years ago Epigastric pain   [facility] Glean Hess, MD      Future Appointments            In 5 days [facility] Melia Jesse Sans, MD [facility], [facility]

## 2021-11-23 ENCOUNTER — Ambulatory Visit (INDEPENDENT_AMBULATORY_CARE_PROVIDER_SITE_OTHER): Payer: BLUE CROSS/BLUE SHIELD | Admitting: Internal Medicine

## 2021-11-23 ENCOUNTER — Encounter: Payer: Self-pay | Admitting: Internal Medicine

## 2021-11-23 ENCOUNTER — Other Ambulatory Visit: Payer: Self-pay

## 2021-11-23 VITALS — BP 110/76 | HR 81 | Ht 68.0 in | Wt 197.0 lb

## 2021-11-23 DIAGNOSIS — Z1211 Encounter for screening for malignant neoplasm of colon: Secondary | ICD-10-CM

## 2021-11-23 DIAGNOSIS — Z1231 Encounter for screening mammogram for malignant neoplasm of breast: Secondary | ICD-10-CM

## 2021-11-23 DIAGNOSIS — R1312 Dysphagia, oropharyngeal phase: Secondary | ICD-10-CM

## 2021-11-23 DIAGNOSIS — Z1159 Encounter for screening for other viral diseases: Secondary | ICD-10-CM

## 2021-11-23 DIAGNOSIS — Z114 Encounter for screening for human immunodeficiency virus [HIV]: Secondary | ICD-10-CM

## 2021-11-23 DIAGNOSIS — Z0000 Encounter for general adult medical examination without abnormal findings: Secondary | ICD-10-CM | POA: Diagnosis not present

## 2021-11-23 DIAGNOSIS — F331 Major depressive disorder, recurrent, moderate: Secondary | ICD-10-CM

## 2021-11-23 NOTE — Progress Notes (Signed)
Date:  11/23/2021   Name:  Shannon Stanton   DOB:  [birth year]   MRN:  [id number]   Chief Complaint: Annual Exam (Breast Exam. ) Shannon Stanton is a 56 y.o. female who presents today for her Complete Annual Exam. She feels well. She reports exercising - none. She reports she is sleeping well. Breast complaints - none.  Mammogram: 11/2017 DEXA: none Pap smear: discontinued Colonoscopy: 10/2011  Immunization History  Administered Date(s) Administered   Tdap 05/10/2019    Gastroesophageal Reflux She complains of dysphagia. She reports no abdominal pain, no chest pain, no coughing or no wheezing. This is a recurrent problem. The problem occurs frequently. The symptoms are aggravated by certain foods. Pertinent negatives include no fatigue. Past procedures include an EGD. and dilatation.   Lab Results  Component Value Date   NA 141 05/10/2019   K 4.6 05/10/2019   CO2 22 05/10/2019   GLUCOSE 91 05/10/2019   BUN 16 05/10/2019   CREATININE 1.10 (H) 05/10/2019   CALCIUM 9.6 05/10/2019   GFRNONAA 58 (L) 05/10/2019   Lab Results  Component Value Date   CHOL 207 (H) 05/10/2019   HDL 95 05/10/2019   LDLCALC 94 05/10/2019   TRIG 92 05/10/2019   CHOLHDL 2.2 05/10/2019   Lab Results  Component Value Date   TSH 1.510 05/10/2019   No results found for: HGBA1C Lab Results  Component Value Date   WBC 5.6 05/10/2019   HGB 14.8 05/10/2019   HCT 43.2 05/10/2019   MCV 91 05/10/2019   PLT 180 05/10/2019   Lab Results  Component Value Date   ALT 10 05/10/2019   AST 18 05/10/2019   ALKPHOS 78 05/10/2019   BILITOT 1.1 05/10/2019   No results found for: 25OHVITD2, 25OHVITD3, VD25OH   Review of Systems  Constitutional:  Negative for chills, fatigue and fever.  HENT:  Negative for congestion, hearing loss, tinnitus, trouble swallowing and voice change.   Eyes:  Negative for visual disturbance.  Respiratory:  Negative for cough, chest tightness, shortness of breath and wheezing.    Cardiovascular:  Negative for chest pain, palpitations and leg swelling.  Gastrointestinal:  Positive for dysphagia. Negative for abdominal pain, constipation, diarrhea and vomiting.  Endocrine: Negative for polydipsia and polyuria.  Genitourinary:  Negative for dysuria, frequency, genital sores, vaginal bleeding and vaginal discharge.  Musculoskeletal:  Negative for arthralgias, gait problem and joint swelling.  Skin:  Negative for color change and rash.  Neurological:  Negative for dizziness, tremors, light-headedness and headaches.  Hematological:  Negative for adenopathy. Does not bruise/bleed easily.  Psychiatric/Behavioral:  Negative for dysphoric mood and sleep disturbance. The patient is not nervous/anxious.    Patient Active Problem List   Diagnosis Date Noted   Moderate episode of recurrent major depressive disorder (HCC) 09/23/2021   Status post hysterectomy 12/10/2020   Dietary B12 deficiency 06/20/2019   Menopausal symptoms 10/11/2017   Tobacco use disorder, mild, in sustained remission 09/30/2015   Attention deficit disorder with hyperactivity 09/30/2015   Asthma, mild intermittent 09/30/2015   Acid reflux 09/30/2015   Acne erythematosa 09/30/2015   Arthralgia of temporomandibular joint 09/30/2015    Allergies  Allergen Reactions   Augmentin [Amoxicillin-Pot Clavulanate] Other (See Comments)    Liver shuts down   Meloxicam Shortness Of Breath   Hydrocodone Rash   Penicillins Rash    Past Surgical History:  Procedure Laterality Date   ABDOMINAL HYSTERECTOMY  2011   endometrial dysplasia   AUGMENTATION MAMMAPLASTY  Bilateral 2010   OOPHORECTOMY Right 2011   TUBAL LIGATION      Social History   Tobacco Use   Smoking status: Former    Types: Cigarettes    Quit date: 05/2017    Years since quitting: 4.5   Smokeless tobacco: Never  Vaping Use   Vaping Use: Never used  Substance Use Topics   Alcohol use: Yes    Alcohol/week: 2.0 standard drinks    Types:  2 Standard drinks or equivalent per week     Medication list has been reviewed and updated.  Current Meds  Medication Sig   albuterol (VENTOLIN HFA) 108 (90 Base) MCG/ACT inhaler TAKE 2 PUFFS BY MOUTH EVERY 6 HOURS AS NEEDED FOR WHEEZE OR SHORTNESS OF BREATH   buPROPion (WELLBUTRIN XL) 150 MG 24 hr tablet TAKE 1 TABLET BY MOUTH EVERY DAY   DULERA 200-5 MCG/ACT AERO Inhale 2 puffs into the lungs 2 (two) times daily.   fexofenadine (ALLEGRA) 30 MG tablet Take 30 mg by mouth 2 (two) times daily.    PHQ 2/9 Scores 11/23/2021 10/23/2021 09/23/2021 08/07/2020  PHQ - 2 Score 0 0 4 4  PHQ- 9 Score 2 1 13 10     GAD 7 : Generalized Anxiety Score 11/23/2021 10/23/2021 09/23/2021 08/07/2020  Nervous, Anxious, on Edge 1 0 0 2  Control/stop worrying 0 0 1 2  Worry too much - different things 0 0 0 2  Trouble relaxing 1 0 0 0  Restless 0 0 0 0  Easily annoyed or irritable 0 0 1 0  Afraid - awful might happen 0 0 0 0  Total GAD 7 Score 2 0 2 6  Anxiety Difficulty Not difficult at all Not difficult at all Somewhat difficult Not difficult at all    BP Readings from Last 3 Encounters:  11/23/21 110/76  10/23/21 110/74  09/23/21 122/64    Physical Exam Vitals and nursing note reviewed.  Constitutional:      General: She is not in acute distress.    Appearance: She is well-developed.  HENT:     Head: Normocephalic and atraumatic.     Right Ear: Tympanic membrane and ear canal normal.     Left Ear: Tympanic membrane and ear canal normal.     Nose:     Right Sinus: No maxillary sinus tenderness.     Left Sinus: No maxillary sinus tenderness.  Eyes:     General: No scleral icterus.       Right eye: No discharge.        Left eye: No discharge.     Conjunctiva/sclera: Conjunctivae normal.  Neck:     Thyroid: No thyromegaly.     Vascular: No carotid bruit.  Cardiovascular:     Rate and Rhythm: Normal rate and regular rhythm.     Pulses: Normal pulses.     Heart sounds: Normal heart  sounds.  Pulmonary:     Effort: Pulmonary effort is normal. No respiratory distress.     Breath sounds: No wheezing.  Chest:  Breasts:    Right: No mass, nipple discharge, skin change or tenderness.     Left: No mass, nipple discharge, skin change or tenderness.  Abdominal:     General: Bowel sounds are normal.     Palpations: Abdomen is soft.     Tenderness: There is no abdominal tenderness.  Musculoskeletal:     Cervical back: Normal range of motion. No erythema.     Right lower leg: No  edema.     Left lower leg: No edema.  Lymphadenopathy:     Cervical: No cervical adenopathy.  Skin:    General: Skin is warm and dry.     Findings: No rash.  Neurological:     Mental Status: She is alert and oriented to person, place, and time.     Cranial Nerves: No cranial nerve deficit.     Sensory: No sensory deficit.     Deep Tendon Reflexes: Reflexes are normal and symmetric.  Psychiatric:        Attention and Perception: Attention normal.        Mood and Affect: Mood normal.    Wt Readings from Last 3 Encounters:  11/23/21 197 lb (89.4 kg)  10/23/21 192 lb 12.8 oz (87.5 kg)  09/23/21 189 lb 9.6 oz (86 kg)    BP 110/76    Pulse 81    Ht 5\' 8"  (1.727 m)    Wt 197 lb (89.4 kg)    SpO2 97%    BMI 29.95 kg/m   Assessment and Plan: 1. Annual physical exam Normal exam; continue healthy diet and increase exercise. She declines Covid, Flu and Shingles vaccines - CBC with Differential/Platelet - Comprehensive metabolic panel - Lipid panel - TSH  2. Encounter for screening mammogram for breast cancer Schedule at [facility] - MM 3D SCREEN BREAST BILATERAL  3. Colon cancer screening due - Ambulatory referral to Gastroenterology  4. Need for hepatitis C screening test - Hepatitis C antibody  5. Moderate episode of recurrent major depressive disorder (HCC) Clinically stable on current regimen with good control of symptoms, No SI or HI. Will continue current therapy.  6. Encounter for  screening for HIV - HIV Antibody (routine testing w rflx)  7. Oropharyngeal dysphagia Has been treated by Dr. Tiffany Kocher in the past with EGD/dilatation Will refer back to [facility] for Colonoscopy/EGD - Ambulatory referral to Gastroenterology   Partially dictated using Dragon software. Any errors are unintentional.  Halina Maidens, MD [facility]  11/23/2021

## 2021-11-24 LAB — COMPREHENSIVE METABOLIC PANEL
ALT: 11 IU/L (ref 0–32)
AST: 16 IU/L (ref 0–40)
Albumin/Globulin Ratio: 1.7 (ref 1.2–2.2)
Albumin: 4.5 g/dL (ref 3.8–4.9)
Alkaline Phosphatase: 85 IU/L (ref 44–121)
BUN/Creatinine Ratio: 10 (ref 9–23)
BUN: 11 mg/dL (ref 6–24)
Bilirubin Total: 0.7 mg/dL (ref 0.0–1.2)
CO2: 25 mmol/L (ref 20–29)
Calcium: 9.6 mg/dL (ref 8.7–10.2)
Chloride: 100 mmol/L (ref 96–106)
Creatinine, Ser: 1.05 mg/dL — ABNORMAL HIGH (ref 0.57–1.00)
Globulin, Total: 2.6 g/dL (ref 1.5–4.5)
Glucose: 100 mg/dL — ABNORMAL HIGH (ref 70–99)
Potassium: 4.7 mmol/L (ref 3.5–5.2)
Sodium: 139 mmol/L (ref 134–144)
Total Protein: 7.1 g/dL (ref 6.0–8.5)
eGFR: 63 mL/min/{1.73_m2} (ref >59)

## 2021-11-24 LAB — HIV ANTIBODY (ROUTINE TESTING W REFLEX): HIV Screen 4th Generation wRfx: NONREACTIVE

## 2021-11-24 LAB — CBC WITH DIFFERENTIAL/PLATELET
Basophils Absolute: 0 10*3/uL (ref 0.0–0.2)
Basos: 1 %
EOS (ABSOLUTE): 0.2 10*3/uL (ref 0.0–0.4)
Eos: 3 %
Hematocrit: 40.7 % (ref 34.0–46.6)
Hemoglobin: 14.6 g/dL (ref 11.1–15.9)
Immature Grans (Abs): 0 10*3/uL (ref 0.0–0.1)
Immature Granulocytes: 1 %
Lymphocytes Absolute: 1.4 10*3/uL (ref 0.7–3.1)
Lymphs: 27 %
MCH: 31.9 pg (ref 26.6–33.0)
MCHC: 35.9 g/dL — ABNORMAL HIGH (ref 31.5–35.7)
MCV: 89 fL (ref 79–97)
Monocytes Absolute: 0.6 10*3/uL (ref 0.1–0.9)
Monocytes: 10 %
Neutrophils Absolute: 3.1 10*3/uL (ref 1.4–7.0)
Neutrophils: 58 %
Platelets: 185 10*3/uL (ref 150–450)
RBC: 4.58 x10E6/uL (ref 3.77–5.28)
RDW: 12.6 % (ref 11.7–15.4)
WBC: 5.4 10*3/uL (ref 3.4–10.8)

## 2021-11-24 LAB — LIPID PANEL
Chol/HDL Ratio: 2.7 ratio (ref 0.0–4.4)
Cholesterol, Total: 225 mg/dL — ABNORMAL HIGH (ref 100–199)
HDL: 84 mg/dL (ref >39)
LDL Chol Calc (NIH): 109 mg/dL — ABNORMAL HIGH (ref 0–99)
Triglycerides: 192 mg/dL — ABNORMAL HIGH (ref 0–149)
VLDL Cholesterol Cal: 32 mg/dL (ref 5–40)

## 2021-11-24 LAB — HEPATITIS C ANTIBODY: Hep C Virus Ab: <0.1 s/co ratio (ref 0.0–0.9)

## 2021-11-24 LAB — TSH: TSH: 1.87 u[IU]/mL (ref 0.450–4.500)

## 2021-12-17 ENCOUNTER — Other Ambulatory Visit: Payer: Self-pay | Admitting: Internal Medicine

## 2021-12-17 ENCOUNTER — Other Ambulatory Visit: Payer: Self-pay

## 2021-12-17 ENCOUNTER — Telehealth: Payer: Self-pay

## 2021-12-17 DIAGNOSIS — N644 Mastodynia: Secondary | ICD-10-CM

## 2021-12-17 NOTE — Telephone Encounter (Signed)
Called pt as a reminder to call and schedule mammogram. Pt verbalized understanding.  KP 

## 2021-12-17 NOTE — Telephone Encounter (Signed)
Pt called saying when she called to make her appt they told her her provider would need to schedule it because pt told them she had soreness in her breast.  CB#  [phone]

## 2021-12-18 ENCOUNTER — Other Ambulatory Visit: Payer: Self-pay

## 2021-12-18 DIAGNOSIS — N644 Mastodynia: Secondary | ICD-10-CM

## 2021-12-19 ENCOUNTER — Other Ambulatory Visit: Payer: Self-pay | Admitting: Internal Medicine

## 2021-12-19 DIAGNOSIS — J4520 Mild intermittent asthma, uncomplicated: Secondary | ICD-10-CM

## 2021-12-19 NOTE — Telephone Encounter (Signed)
Requested medication (s) are due for refill today: yes  Requested medication (s) are on the active medication list: yes  Last refill:  10/20/21  Future visit scheduled: no  Notes to clinic:  Historical Provider, please assess.       Requested Prescriptions  Pending Prescriptions Disp Refills   [place] 200-5 MCG/ACT AERO [Pharmacy Med Name: DULERA 200 MCG-5 MCG INHALER] 13 each 3    Sig: INHALE 2 PUFFS BY MOUTH INTO THE LUNGS DAILY     Pulmonology:  Combination Products Passed - 12/19/2021  1:09 AM      Passed - Valid encounter within last 12 months    Recent Outpatient Visits           3 weeks ago Annual physical exam   [facility] Glean Hess, MD   1 month ago Moderate episode of recurrent major depressive disorder [facility])   [facility] Glean Hess, MD   2 months ago Acute non-recurrent sinusitis, unspecified location   [facility] Glean Hess, MD   1 year ago Acute non-recurrent sinusitis, unspecified location   [facility] Glean Hess, MD   2 years ago Other microscopic hematuria   [facility] Glean Hess, MD

## 2021-12-22 ENCOUNTER — Other Ambulatory Visit: Payer: Self-pay | Admitting: Internal Medicine

## 2021-12-22 NOTE — Telephone Encounter (Signed)
Copied from [place] [id number]. Topic: Quick Communication - Rx Refill/Question >> Dec 22, 2021  1:42 PM Leward Quan A wrote: Medication: DULERA 200-5 MCG/ACT AERO  Has the patient contacted their pharmacy? No. Pharmacy called patient  (Agent: If no, request that the patient contact the pharmacy for the refill. If patient does not wish to contact the pharmacy document the reason why and proceed with request.) (Agent: If yes, when and what did the pharmacy advise?)  Preferred Pharmacy (with phone number or street name): [id number] [place], [place]  Phone:  [phone] Fax:  [phone]    Has the patient been seen for an appointment in the last year OR does the patient have an upcoming appointment? Yes.    Agent: Please be advised that RX refills may take up to 3 business days. We ask that you follow-up with your pharmacy.

## 2021-12-23 NOTE — Telephone Encounter (Signed)
Requested medication (s) are due for refill today:   This is not covered by pt's insurance per the note.  Requested medication (s) are on the active medication list:   N/A  Future visit scheduled:   No   Had CPE a month ago   Last ordered: Returned because not covered by [employer].   Requested Prescriptions  Pending Prescriptions Disp Refills   DULERA 200-5 MCG/ACT AERO 1 each     Sig: Inhale 2 puffs into the lungs 2 (two) times daily.     Pulmonology:  Combination Products Passed - 12/22/2021  4:40 PM      Passed - Valid encounter within last 12 months    Recent Outpatient Visits           1 month ago Annual physical exam   [facility], [facility]   2 months ago Moderate episode of recurrent major depressive disorder [facility])   [facility] Glean Hess, [facility]   3 months ago Acute non-recurrent sinusitis, unspecified location   [facility] Glean Hess, MD   1 year ago Acute non-recurrent sinusitis, unspecified location   [facility] Glean Hess, MD   2 years ago Other microscopic hematuria   [facility] Glean Hess, MD

## 2021-12-31 ENCOUNTER — Ambulatory Visit
Admission: RE | Admit: 2021-12-31 | Discharge: 2021-12-31 | Disposition: A | Discharge status: Home / Self Care | Payer: BLUE CROSS/BLUE SHIELD | Source: Ambulatory Visit | Attending: Internal Medicine | Admitting: Internal Medicine

## 2021-12-31 ENCOUNTER — Other Ambulatory Visit: Payer: Self-pay

## 2021-12-31 DIAGNOSIS — R922 Inconclusive mammogram: Secondary | ICD-10-CM | POA: Diagnosis not present

## 2021-12-31 DIAGNOSIS — N644 Mastodynia: Secondary | ICD-10-CM

## 2022-01-12 ENCOUNTER — Ambulatory Visit: Payer: BLUE CROSS/BLUE SHIELD | Admitting: Internal Medicine

## 2022-01-12 ENCOUNTER — Other Ambulatory Visit: Payer: Self-pay | Admitting: Internal Medicine

## 2022-01-12 MED ORDER — DULERA 200-5 MCG/ACT IN AERO: 2.0000 | INHALATION_SPRAY | Freq: Two times a day (BID) | RESPIRATORY_TRACT | 0 refills | Status: DC

## 2022-01-12 NOTE — Telephone Encounter (Signed)
Medication Refill - Medication:  ?DULERA 200-5 MCG/ACT AERO  ? ?Has the patient contacted their pharmacy? Yes.   ?Contact PCP ? ?Preferred Pharmacy (with phone number or street name):  ?[id number] [place] [place] - [address]  ?[address] [place], [place] [address]  ?Phone:  [phone]  Fax:  [phone] ? ?Has the patient been seen for an appointment in the last year OR does the patient have an upcoming appointment? Yes.   ? ?Agent: Please be advised that RX refills may take up to 3 business days. We ask that you follow-up with your pharmacy. ?

## 2022-01-12 NOTE — Telephone Encounter (Signed)
Requested medication (s) are due for refill today: Yes ? ?Requested medication (s) are on the active medication list: Yes ? ?Last refill:  10/20/21 ? ?Future visit scheduled: No ? ?Notes to clinic:  Historical provider. ? ? ? ?Requested Prescriptions  ?Pending Prescriptions Disp Refills  ? DULERA 200-5 MCG/ACT AERO 1 each   ?  Sig: Inhale 2 puffs into the lungs 2 (two) times daily.  ?  ? Pulmonology:  Combination Products Passed - 01/12/2022  9:58 AM  ?  ?  Passed - Valid encounter within last 12 months  ?  Recent Outpatient Visits   ? ?      ? 1 month ago Annual physical exam  ? [facility] Glean Hess, MD  ? 2 months ago Moderate episode of recurrent major depressive disorder ([place])  ? [facility] Glean Hess, MD  ? 3 months ago Acute non-recurrent sinusitis, unspecified location  ? [facility] Glean Hess, MD  ? 1 year ago Acute non-recurrent sinusitis, unspecified location  ? [facility] Glean Hess, MD  ? 2 years ago Other microscopic hematuria  ? [facility] Glean Hess, MD  ? ?  ?  ? ?  ?  ?  ? ?

## 2022-03-27 ENCOUNTER — Other Ambulatory Visit: Payer: Self-pay | Admitting: Internal Medicine

## 2022-03-30 NOTE — Telephone Encounter (Signed)
Requested Prescriptions  Pending Prescriptions Disp Refills  . [place] 200-5 MCG/ACT AERO [Pharmacy Med Name: DULERA 200 MCG-5 MCG INHALER] 1 each 2    Sig: INHALE 2 PUFFS INTO THE LUNGS TWICE A DAY     Pulmonology:  Combination Products Passed - 03/27/2022 11:37 AM      Passed - Valid encounter within last 12 months    Recent Outpatient Visits          4 months ago Annual physical exam   [facility] Gle[facility] Hess, MD   5 months ago Moderate episode of recurrent major depressive disorder [facility])   [facility], [facility]   6 months ago Acute non-recurrent sinusitis, unspecified location   [facility] Gle[facility], [facility]   1 year ago Acute non-recurrent sinusitis, unspecified location   [facility] Glean Hess, MD   2 years ago Other microscopic hematuria   [facility] Glean Hess, MD

## 2022-05-09 DIAGNOSIS — J0120 Acute ethmoidal sinusitis, unspecified: Secondary | ICD-10-CM | POA: Diagnosis not present

## 2022-07-03 ENCOUNTER — Other Ambulatory Visit: Payer: Self-pay | Admitting: Internal Medicine

## 2022-07-03 DIAGNOSIS — F331 Major depressive disorder, recurrent, moderate: Secondary | ICD-10-CM

## 2022-07-06 NOTE — Telephone Encounter (Signed)
Requested Prescriptions  Pending Prescriptions Disp Refills  . buPROPion (WELLBUTRIN XL) 150 MG 24 hr tablet [Pharmacy Med Name: BUPROPION HCL XL 150 MG TABLET] 90 tablet 0    Sig: TAKE 1 TABLET BY MOUTH EVERY DAY     Psychiatry: Antidepressants - bupropion Failed - 07/03/2022  1:18 AM      Failed - Cr in normal range and within 360 days    Creatinine, Ser  Date Value Ref Range Status  11/23/2021 1.05 (H) 0.57 - 1.00 mg/dL Final         Failed - Valid encounter within last 6 months    Recent Outpatient Visits          7 months ago Annual physical exam   [place] [facility] at [facility], Nyoka Cowden, MD   8 months ago Moderate episode of recurrent major depressive disorder [facility])   [place] [facility] at [facility], Nyoka Cowden, MD   9 months ago Acute non-recurrent sinusitis, unspecified location   [facility] at [facility], Nyoka Cowden, MD   1 year ago Acute non-recurrent sinusitis, unspecified location   [facility] at [facility], Nyoka Cowden, MD   2 years ago Other microscopic hematuria   [place] [facility] at [facility], Nyoka Cowden, MD             Passed - AST in normal range and within 360 days    AST  Date Value Ref Range Status  11/23/2021 16 0 - 40 IU/L Final         Passed - ALT in normal range and within 360 days    ALT  Date Value Ref Range Status  11/23/2021 11 0 - 32 IU/L Final         Passed - Completed PHQ-2 or PHQ-9 in the last 360 days      Passed - Last BP in normal range    BP Readings from Last 1 Encounters:  11/23/21 110/76

## 2022-07-17 DIAGNOSIS — F900 Attention-deficit hyperactivity disorder, predominantly inattentive type: Secondary | ICD-10-CM | POA: Diagnosis not present

## 2022-11-14 ENCOUNTER — Other Ambulatory Visit: Payer: Self-pay | Admitting: Internal Medicine

## 2022-11-14 DIAGNOSIS — J069 Acute upper respiratory infection, unspecified: Secondary | ICD-10-CM | POA: Diagnosis not present

## 2022-12-02 DIAGNOSIS — J45901 Unspecified asthma with (acute) exacerbation: Secondary | ICD-10-CM | POA: Diagnosis not present

## 2022-12-02 DIAGNOSIS — R053 Chronic cough: Secondary | ICD-10-CM | POA: Diagnosis not present

## 2022-12-02 DIAGNOSIS — Z03818 Encounter for observation for suspected exposure to other biological agents ruled out: Secondary | ICD-10-CM | POA: Diagnosis not present

## 2022-12-02 DIAGNOSIS — R059 Cough, unspecified: Secondary | ICD-10-CM | POA: Diagnosis not present

## 2022-12-02 DIAGNOSIS — R058 Other specified cough: Secondary | ICD-10-CM | POA: Diagnosis not present

## 2023-01-12 DIAGNOSIS — F900 Attention-deficit hyperactivity disorder, predominantly inattentive type: Secondary | ICD-10-CM | POA: Diagnosis not present

## 2023-01-16 ENCOUNTER — Other Ambulatory Visit: Payer: Self-pay | Admitting: Internal Medicine

## 2023-01-17 ENCOUNTER — Other Ambulatory Visit: Payer: Self-pay | Admitting: Internal Medicine

## 2023-01-18 ENCOUNTER — Other Ambulatory Visit: Payer: Self-pay | Admitting: Internal Medicine

## 2023-01-18 ENCOUNTER — Telehealth: Payer: Self-pay | Admitting: Internal Medicine

## 2023-01-18 DIAGNOSIS — J4520 Mild intermittent asthma, uncomplicated: Secondary | ICD-10-CM

## 2023-01-18 MED ORDER — DULERA 200-5 MCG/ACT IN AERO: INHALATION_SPRAY | RESPIRATORY_TRACT | 2 refills | Status: DC

## 2023-01-18 MED ORDER — ALBUTEROL SULFATE HFA 108 (90 BASE) MCG/ACT IN AERS: INHALATION_SPRAY | RESPIRATORY_TRACT | 1 refills | Status: DC

## 2023-01-18 NOTE — Telephone Encounter (Signed)
Patient called back to the office and stated that she wants a mychart vv because she can not get off work. Patient is willing to reschedule another time but is needing her inhaler medications. Patient would like a call back.

## 2023-01-18 NOTE — Telephone Encounter (Signed)
Requested Prescriptions  Pending Prescriptions Disp Refills   [place] 200-5 MCG/ACT AERO [Pharmacy Med Name: DULERA 200 MCG-5 MCG INHALER] 13 each 0    Sig: INHALE 2 PUFFS INTO THE LUNGS TWICE A DAY     Pulmonology:  Combination Products Failed - 01/16/2023  1:06 PM      Failed - Valid encounter within last 12 months    Recent Outpatient Visits           1 year ago Annual physical exam   [place] [facility] at [facility], Jesse Sans, MD   1 year ago Moderate episode of recurrent major depressive disorder [facility])   [place] [facility] at [facility], Jesse Sans, MD   1 year ago Acute non-recurrent sinusitis, unspecified location   [facility] at [facility], Jesse Sans, MD   2 years ago Acute non-recurrent sinusitis, unspecified location   [facility] at [facility], Jesse Sans, MD   3 years ago Other microscopic hematuria   [facility] [place] [facility], Jesse Sans, MD

## 2023-01-19 ENCOUNTER — Ambulatory Visit: Payer: Medicaid Other | Admitting: Internal Medicine

## 2023-01-19 NOTE — Telephone Encounter (Signed)
Patient informed. Will keep appt for May.  - Shannon Stanton

## 2023-03-03 ENCOUNTER — Ambulatory Visit (INDEPENDENT_AMBULATORY_CARE_PROVIDER_SITE_OTHER): Payer: BLUE CROSS/BLUE SHIELD | Admitting: Internal Medicine

## 2023-03-03 ENCOUNTER — Encounter: Payer: Self-pay | Admitting: Internal Medicine

## 2023-03-03 VITALS — BP 118/78 | HR 96 | Ht 68.0 in | Wt 201.4 lb

## 2023-03-03 DIAGNOSIS — L719 Rosacea, unspecified: Secondary | ICD-10-CM

## 2023-03-03 DIAGNOSIS — Z131 Encounter for screening for diabetes mellitus: Secondary | ICD-10-CM

## 2023-03-03 DIAGNOSIS — J4520 Mild intermittent asthma, uncomplicated: Secondary | ICD-10-CM

## 2023-03-03 DIAGNOSIS — Z0000 Encounter for general adult medical examination without abnormal findings: Secondary | ICD-10-CM | POA: Diagnosis not present

## 2023-03-03 DIAGNOSIS — K219 Gastro-esophageal reflux disease without esophagitis: Secondary | ICD-10-CM

## 2023-03-03 DIAGNOSIS — Z13220 Encounter for screening for lipoid disorders: Secondary | ICD-10-CM

## 2023-03-03 DIAGNOSIS — Z1231 Encounter for screening mammogram for malignant neoplasm of breast: Secondary | ICD-10-CM

## 2023-03-03 DIAGNOSIS — F331 Major depressive disorder, recurrent, moderate: Secondary | ICD-10-CM

## 2023-03-03 DIAGNOSIS — Z1211 Encounter for screening for malignant neoplasm of colon: Secondary | ICD-10-CM

## 2023-03-03 MED ORDER — PANTOPRAZOLE SODIUM 40 MG PO TBEC: 40.0000 mg | DELAYED_RELEASE_TABLET | Freq: Every day | ORAL | 0 refills | Status: DC

## 2023-03-03 MED ORDER — BUPROPION HCL ER (XL) 150 MG PO TB24: 150.0000 mg | ORAL_TABLET | Freq: Every day | ORAL | 1 refills | Status: DC

## 2023-03-03 MED ORDER — METRONIDAZOLE 1 % EX GEL: Freq: Every day | CUTANEOUS | 0 refills | Status: AC

## 2023-03-03 NOTE — Assessment & Plan Note (Signed)
Having more daily symptoms with hx of stricture Will try Protonix daily until seen by GI

## 2023-03-03 NOTE — Patient Instructions (Addendum)
Call [facility] to schedule your mammogram at [phone].   -It was a pleasure to see you today! Please review your visit summary for helpful information. -Lab results are usually available within 1-2 days and we will call once reviewed. -I would encourage you to follow your care via MyChart where you can access lab results, notes, messages, and more. -If you feel that we did a nice job today, please complete your after-visit survey and leave [place] a Google review! Your CMA today was Chassidy and your provider was Dr Bari Edward, MD.

## 2023-03-03 NOTE — Progress Notes (Signed)
Date:  03/03/2023   Name:  Shannon Stanton   DOB:  Jun 25, 1966   MRN:  [id number]   Chief Complaint: Annual Exam Shannon Stanton is a 57 y.o. female who presents today for her Complete Annual Exam. She feels fairly well. She reports exercising none. She reports she is sleeping fairly well. Breast complaints none.  Mammogram: 12/2021 DEXA: none Pap smear: discontinued Colonoscopy: 10/2011  Health Maintenance Due  Topic Date Due   COVID-19 Vaccine (1) Never done   Zoster Vaccines- Shingrix (1 of 2) Never done   COLONOSCOPY (Pts 45-40yrs Insurance coverage will need to be confirmed)  10/27/2021   MAMMOGRAM  01/01/2023    Immunization History  Administered Date(s) Administered   Tdap 05/10/2019    Asthma There is no cough, shortness of breath or wheezing. This is a recurrent problem. Associated symptoms include heartburn. Pertinent negatives include no chest pain, fever, headaches or trouble swallowing. Her symptoms are not alleviated by beta-agonist and steroid inhaler. Her past medical history is significant for asthma.  Rash This is a recurrent problem. The affected locations include the face. The rash is characterized by redness and burning. She was exposed to nothing. Pertinent negatives include no congestion, cough, diarrhea, fatigue, fever, shortness of breath or vomiting. Her past medical history is significant for asthma.  Anxiety Presents for follow-up visit. Symptoms include nervous/anxious behavior. Patient reports no chest pain, dizziness, palpitations or shortness of breath. The severity of symptoms is moderate. The quality of sleep is good.   Her past medical history is significant for asthma. Compliance with medications is 76-100%.  Gastroesophageal Reflux She complains of globus sensation and heartburn. She reports no abdominal pain, no chest pain, no coughing or no wheezing. This is a recurrent problem. The problem occurs frequently. Pertinent negatives include no  fatigue. She has tried a PPI, an herbal remedy and an antacid for the symptoms. The treatment provided no relief. Past procedures include an EGD (and dilatation).    Lab Results  Component Value Date   NA 139 11/23/2021   K 4.7 11/23/2021   CO2 25 11/23/2021   GLUCOSE 100 (H) 11/23/2021   BUN 11 11/23/2021   CREATININE 1.05 (H) 11/23/2021   CALCIUM 9.6 11/23/2021   EGFR 63 11/23/2021   GFRNONAA 58 (L) 05/10/2019   Lab Results  Component Value Date   CHOL 225 (H) 11/23/2021   HDL 84 11/23/2021   LDLCALC 109 (H) 11/23/2021   TRIG 192 (H) 11/23/2021   CHOLHDL 2.7 11/23/2021   Lab Results  Component Value Date   TSH 1.870 11/23/2021   No results found for: "HGBA1C" Lab Results  Component Value Date   WBC 5.4 11/23/2021   HGB 14.6 11/23/2021   HCT 40.7 11/23/2021   MCV 89 11/23/2021   PLT 185 11/23/2021   Lab Results  Component Value Date   ALT 11 11/23/2021   AST 16 11/23/2021   ALKPHOS 85 11/23/2021   BILITOT 0.7 11/23/2021   No results found for: "25OHVITD2", "25OHVITD3", "VD25OH"   Review of Systems  Constitutional:  Positive for unexpected weight change. Negative for chills, fatigue and fever.  HENT:  Negative for congestion, hearing loss, tinnitus, trouble swallowing and voice change.   Eyes:  Negative for visual disturbance.  Respiratory:  Negative for cough, chest tightness, shortness of breath and wheezing.   Cardiovascular:  Negative for chest pain, palpitations and leg swelling.  Gastrointestinal:  Positive for heartburn. Negative for abdominal pain, constipation, diarrhea and  vomiting.  Endocrine: Negative for polydipsia and polyuria.  Genitourinary:  Negative for dysuria, frequency, genital sores, vaginal bleeding and vaginal discharge.  Musculoskeletal:  Negative for arthralgias, gait problem and joint swelling.  Skin:  Positive for rash. Negative for color change.  Neurological:  Negative for dizziness, tremors, light-headedness and headaches.   Hematological:  Negative for adenopathy. Does not bruise/bleed easily.  Psychiatric/Behavioral:  Negative for dysphoric mood and sleep disturbance. The patient is nervous/anxious.     Patient Active Problem List   Diagnosis Date Noted   Moderate episode of recurrent major depressive disorder (HCC) 09/23/2021   Status post hysterectomy 12/10/2020   Dietary B12 deficiency 06/20/2019   Menopausal symptoms 10/11/2017   Tobacco use disorder, mild, in sustained remission 09/30/2015   Attention deficit disorder with hyperactivity 09/30/2015   Asthma, mild intermittent 09/30/2015   Acid reflux 09/30/2015   Rosacea 09/30/2015   Arthralgia of temporomandibular joint 09/30/2015    Allergies  Allergen Reactions   Augmentin [Amoxicillin-Pot Clavulanate] Other (See Comments)    Liver shuts down   Meloxicam Shortness Of Breath   Hydrocodone Rash   Penicillins Rash    Past Surgical History:  Procedure Laterality Date   ABDOMINAL HYSTERECTOMY  2011   endometrial dysplasia   AUGMENTATION MAMMAPLASTY Bilateral 2010   OOPHORECTOMY Right 2011   TUBAL LIGATION      Social History   Tobacco Use   Smoking status: Former    Types: Cigarettes    Quit date: 05/2017    Years since quitting: 5.8   Smokeless tobacco: Never  Vaping Use   Vaping Use: Every day  Substance Use Topics   Alcohol use: Yes    Alcohol/week: 2.0 standard drinks of alcohol    Types: 2 Standard drinks or equivalent per week     Medication list has been reviewed and updated.  Current Meds  Medication Sig   albuterol (VENTOLIN HFA) 108 (90 Base) MCG/ACT inhaler TAKE 2 PUFFS BY MOUTH EVERY 6 HOURS AS NEEDED FOR WHEEZE OR SHORTNESS OF BREATH   dextroamphetamine (DEXEDRINE SPANSULE) 15 MG 24 hr capsule Take 15 mg by mouth daily.   DULERA 200-5 MCG/ACT AERO INHALE 2 PUFFS INTO THE LUNGS TWICE A DAY   fexofenadine (ALLEGRA) 30 MG tablet Take 30 mg by mouth 2 (two) times daily.   metroNIDAZOLE (METROGEL) 1 % gel Apply  topically daily. To face   pantoprazole (PROTONIX) 40 MG tablet Take 1 tablet (40 mg total) by mouth daily.   [DISCONTINUED] buPROPion (WELLBUTRIN XL) 150 MG 24 hr tablet TAKE 1 TABLET BY MOUTH EVERY DAY       03/03/2023   10:49 AM 11/23/2021   10:01 AM 10/23/2021    1:43 PM 09/23/2021    8:29 AM  GAD 7 : Generalized Anxiety Score  Nervous, Anxious, on Edge 1 1 0 0  Control/stop worrying 0 0 0 1  Worry too much - different things 1 0 0 0  Trouble relaxing 0 1 0 0  Restless 0 0 0 0  Easily annoyed or irritable 0 0 0 1  Afraid - awful might happen 0 0 0 0  Total GAD 7 Score 2 2 0 2  Anxiety Difficulty Not difficult at all Not difficult at all Not difficult at all Somewhat difficult       03/03/2023   10:49 AM 11/23/2021   10:00 AM 10/23/2021    1:43 PM  Depression screen PHQ 2/9  Decreased Interest 3 0 0  Down, Depressed, Hopeless  1 0 0  PHQ - 2 Score 4 0 0  Altered sleeping 2 0 1  Tired, decreased energy 3 0 0  Change in appetite 1 0 0  Feeling bad or failure about yourself  0 1 0  Trouble concentrating 1 1 0  Moving slowly or fidgety/restless 0 0 0  Suicidal thoughts 0 0 0  PHQ-9 Score 11 2 1   Difficult doing work/chores Not difficult at all Somewhat difficult Not difficult at all    BP Readings from Last 3 Encounters:  03/03/23 118/78  11/23/21 110/76  10/23/21 110/74    Physical Exam Vitals and nursing note reviewed.  Constitutional:      General: She is not in acute distress.    Appearance: She is well-developed.  HENT:     Head: Normocephalic and atraumatic.     Right Ear: Tympanic membrane and ear canal normal.     Left Ear: Tympanic membrane and ear canal normal.     Nose:     Right Sinus: No maxillary sinus tenderness.     Left Sinus: No maxillary sinus tenderness.  Eyes:     General: No scleral icterus.       Right eye: No discharge.        Left eye: No discharge.     Conjunctiva/sclera: Conjunctivae normal.  Neck:     Thyroid: No thyromegaly.      Vascular: No carotid bruit.  Cardiovascular:     Rate and Rhythm: Normal rate and regular rhythm.     Pulses: Normal pulses.     Heart sounds: Normal heart sounds.  Pulmonary:     Effort: Pulmonary effort is normal. No respiratory distress.     Breath sounds: No wheezing.  Chest:  Breasts:    Right: No mass, nipple discharge, skin change or tenderness.     Left: No mass, nipple discharge, skin change or tenderness.     Comments: Soft implants bilaterally Abdominal:     General: Bowel sounds are normal.     Palpations: Abdomen is soft.     Tenderness: There is no abdominal tenderness.  Musculoskeletal:     Cervical back: Normal range of motion. No erythema.     Right lower leg: No edema.     Left lower leg: No edema.  Lymphadenopathy:     Cervical: No cervical adenopathy.  Skin:    General: Skin is warm and dry.     Findings: Rash present.     Comments: On cheeks and nose  Neurological:     Mental Status: She is alert and oriented to person, place, and time.     Cranial Nerves: No cranial nerve deficit.     Sensory: No sensory deficit.     Deep Tendon Reflexes: Reflexes are normal and symmetric.  Psychiatric:        Attention and Perception: Attention normal.        Mood and Affect: Mood normal.     Wt Readings from Last 3 Encounters:  03/03/23 201 lb 6.4 oz (91.4 kg)  11/23/21 197 lb (89.4 kg)  10/23/21 192 lb 12.8 oz (87.5 kg)    BP 118/78   Pulse 96   Ht 5\' 8"  (1.727 m)   Wt 201 lb 6.4 oz (91.4 kg)   SpO2 97%   BMI 30.62 kg/m   Assessment and Plan:  Problem List Items Addressed This Visit       Respiratory   Asthma, mild intermittent    Asthma  controlled with Dulera and PRN albuterol.        Digestive   Acid reflux    Having more daily symptoms with hx of stricture Will try Protonix daily until seen by GI      Relevant Medications   pantoprazole (PROTONIX) 40 MG tablet   Other Relevant Orders   MM 3D SCREENING MAMMOGRAM BILATERAL BREAST   CBC  with Differential/Platelet     Other   Moderate episode of recurrent major depressive disorder (HCC)    Clinically stable on current regimen with good control of symptoms, No SI or HI. No change in management at this time. Continue Bupropion      Relevant Medications   buPROPion (WELLBUTRIN XL) 150 MG 24 hr tablet   Other Relevant Orders   TSH   Rosacea    Recurrent symptoms Will start Metrogel topical nightly      Relevant Medications   metroNIDAZOLE (METROGEL) 1 % gel   Other Visit Diagnoses     Annual physical exam    -  Primary   Normal exam except for weight continue dietary efforts such as IF more regular exercise recommended   Relevant Orders   CBC with Differential/Platelet   Comprehensive metabolic panel   Hemoglobin A1c   Lipid panel   TSH   Encounter for screening mammogram for breast cancer       Relevant Orders   MM 3D SCREENING MAMMOGRAM BILATERAL BREAST   Colon cancer screening       Relevant Orders   Ambulatory referral to Gastroenterology   Screening for diabetes mellitus       Relevant Orders   Hemoglobin A1c   Screening for lipid disorders       Relevant Orders   Lipid panel       No follow-ups on file.   Partially dictated using Dragon software, any errors are not intentional.  Reubin Milan, MD [facility] and Sports Medicine [place], [place]

## 2023-03-03 NOTE — Assessment & Plan Note (Signed)
Clinically stable on current regimen with good control of symptoms, No SI or HI. No change in management at this time. Continue Bupropion

## 2023-03-03 NOTE — Assessment & Plan Note (Signed)
Recurrent symptoms Will start Metrogel topical nightly

## 2023-03-03 NOTE — Assessment & Plan Note (Signed)
Asthma controlled with Dulera and PRN albuterol.

## 2023-03-04 LAB — CBC WITH DIFFERENTIAL/PLATELET
Basophils Absolute: 0.1 10*3/uL (ref 0.0–0.2)
Basos: 1 %
EOS (ABSOLUTE): 0.2 10*3/uL (ref 0.0–0.4)
Eos: 3 %
Hematocrit: 43.2 % (ref 34.0–46.6)
Hemoglobin: 14.7 g/dL (ref 11.1–15.9)
Immature Grans (Abs): 0.1 10*3/uL (ref 0.0–0.1)
Immature Granulocytes: 1 %
Lymphocytes Absolute: 1.6 10*3/uL (ref 0.7–3.1)
Lymphs: 24 %
MCH: 31.7 pg (ref 26.6–33.0)
MCHC: 34 g/dL (ref 31.5–35.7)
MCV: 93 fL (ref 79–97)
Monocytes Absolute: 0.6 10*3/uL (ref 0.1–0.9)
Monocytes: 10 %
Neutrophils Absolute: 4.1 10*3/uL (ref 1.4–7.0)
Neutrophils: 61 %
Platelets: 220 10*3/uL (ref 150–450)
RBC: 4.64 x10E6/uL (ref 3.77–5.28)
RDW: 12.9 % (ref 11.7–15.4)
WBC: 6.6 10*3/uL (ref 3.4–10.8)

## 2023-03-04 LAB — COMPREHENSIVE METABOLIC PANEL
ALT: 24 IU/L (ref 0–32)
AST: 27 IU/L (ref 0–40)
Albumin/Globulin Ratio: 1.7 (ref 1.2–2.2)
Albumin: 4.5 g/dL (ref 3.8–4.9)
Alkaline Phosphatase: 94 IU/L (ref 44–121)
BUN/Creatinine Ratio: 18 (ref 9–23)
BUN: 17 mg/dL (ref 6–24)
Bilirubin Total: 0.5 mg/dL (ref 0.0–1.2)
CO2: 21 mmol/L (ref 20–29)
Calcium: 9.2 mg/dL (ref 8.7–10.2)
Chloride: 102 mmol/L (ref 96–106)
Creatinine, Ser: 0.96 mg/dL (ref 0.57–1.00)
Globulin, Total: 2.7 g/dL (ref 1.5–4.5)
Glucose: 95 mg/dL (ref 70–99)
Potassium: 5 mmol/L (ref 3.5–5.2)
Sodium: 139 mmol/L (ref 134–144)
Total Protein: 7.2 g/dL (ref 6.0–8.5)
eGFR: 70 mL/min/{1.73_m2} (ref >59)

## 2023-03-04 LAB — LIPID PANEL
Chol/HDL Ratio: 2.6 ratio (ref 0.0–4.4)
Cholesterol, Total: 214 mg/dL — ABNORMAL HIGH (ref 100–199)
HDL: 82 mg/dL (ref >39)
LDL Chol Calc (NIH): 108 mg/dL — ABNORMAL HIGH (ref 0–99)
Triglycerides: 141 mg/dL (ref 0–149)
VLDL Cholesterol Cal: 24 mg/dL (ref 5–40)

## 2023-03-04 LAB — HEMOGLOBIN A1C
Est. average glucose Bld gHb Est-mCnc: 114 mg/dL
Hgb A1c MFr Bld: 5.6 % (ref 4.8–5.6)

## 2023-03-04 LAB — TSH: TSH: 1.41 u[IU]/mL (ref 0.450–4.500)

## 2023-03-30 ENCOUNTER — Other Ambulatory Visit: Payer: Self-pay | Admitting: Internal Medicine

## 2023-03-30 DIAGNOSIS — K219 Gastro-esophageal reflux disease without esophagitis: Secondary | ICD-10-CM

## 2023-04-08 ENCOUNTER — Other Ambulatory Visit
Admission: RE | Admit: 2023-04-08 | Discharge: 2023-04-08 | Disposition: A | Discharge status: Home / Self Care | Payer: BLUE CROSS/BLUE SHIELD | Source: Ambulatory Visit | Attending: Family Medicine | Admitting: Family Medicine

## 2023-04-08 DIAGNOSIS — M25561 Pain in right knee: Secondary | ICD-10-CM | POA: Diagnosis not present

## 2023-04-08 DIAGNOSIS — R0609 Other forms of dyspnea: Secondary | ICD-10-CM | POA: Diagnosis not present

## 2023-04-08 DIAGNOSIS — M25562 Pain in left knee: Secondary | ICD-10-CM | POA: Diagnosis not present

## 2023-04-08 DIAGNOSIS — M25472 Effusion, left ankle: Secondary | ICD-10-CM | POA: Diagnosis not present

## 2023-04-08 DIAGNOSIS — M25471 Effusion, right ankle: Secondary | ICD-10-CM | POA: Insufficient documentation

## 2023-04-08 DIAGNOSIS — R0600 Dyspnea, unspecified: Secondary | ICD-10-CM | POA: Diagnosis not present

## 2023-04-08 DIAGNOSIS — R34 Anuria and oliguria: Secondary | ICD-10-CM | POA: Diagnosis not present

## 2023-04-08 DIAGNOSIS — M545 Low back pain, unspecified: Secondary | ICD-10-CM | POA: Diagnosis not present

## 2023-04-08 LAB — BRAIN NATRIURETIC PEPTIDE: B Natriuretic Peptide: 19.1 pg/mL (ref 0.0–100.0)

## 2023-04-28 ENCOUNTER — Other Ambulatory Visit: Payer: Self-pay | Admitting: Internal Medicine

## 2023-04-28 DIAGNOSIS — K219 Gastro-esophageal reflux disease without esophagitis: Secondary | ICD-10-CM

## 2023-06-16 ENCOUNTER — Other Ambulatory Visit: Payer: Self-pay | Admitting: Nurse Practitioner

## 2023-06-16 DIAGNOSIS — R110 Nausea: Secondary | ICD-10-CM

## 2023-06-16 DIAGNOSIS — R1310 Dysphagia, unspecified: Secondary | ICD-10-CM | POA: Diagnosis not present

## 2023-06-16 DIAGNOSIS — R6881 Early satiety: Secondary | ICD-10-CM | POA: Diagnosis not present

## 2023-06-16 DIAGNOSIS — K219 Gastro-esophageal reflux disease without esophagitis: Secondary | ICD-10-CM | POA: Diagnosis not present

## 2023-06-16 DIAGNOSIS — R1013 Epigastric pain: Secondary | ICD-10-CM | POA: Diagnosis not present

## 2023-06-30 ENCOUNTER — Ambulatory Visit
Admission: RE | Admit: 2023-06-30 | Discharge: 2023-06-30 | Disposition: A | Discharge status: Home / Self Care | Payer: BLUE CROSS/BLUE SHIELD | Source: Ambulatory Visit | Attending: Nurse Practitioner | Admitting: Nurse Practitioner

## 2023-06-30 DIAGNOSIS — R1013 Epigastric pain: Secondary | ICD-10-CM

## 2023-06-30 DIAGNOSIS — F900 Attention-deficit hyperactivity disorder, predominantly inattentive type: Secondary | ICD-10-CM | POA: Diagnosis not present

## 2023-06-30 DIAGNOSIS — R110 Nausea: Secondary | ICD-10-CM

## 2023-07-08 ENCOUNTER — Other Ambulatory Visit: Payer: Self-pay | Admitting: Internal Medicine

## 2023-07-08 DIAGNOSIS — J4520 Mild intermittent asthma, uncomplicated: Secondary | ICD-10-CM

## 2023-07-11 NOTE — Telephone Encounter (Signed)
Requested Prescriptions  Pending Prescriptions Disp Refills   albuterol (VENTOLIN HFA) 108 (90 Base) MCG/ACT inhaler [Pharmacy Med Name: ALBUTEROL HFA (PROAIR) INHALER] 8.5 each 0    Sig: TAKE 2 PUFFS BY MOUTH EVERY 6 HOURS AS NEEDED FOR WHEEZE OR SHORTNESS OF BREATH     Pulmonology:  Beta Agonists 2 Passed - 07/08/2023  3:14 PM      Passed - Last BP in normal range    BP Readings from Last 1 Encounters:  03/03/23 118/78         Passed - Last Heart Rate in normal range    Pulse Readings from Last 1 Encounters:  03/03/23 96         Passed - Valid encounter within last 12 months    Recent Outpatient Visits           4 months ago Annual physical exam   [place] [facility], Nyoka Cowden, MD   1 year ago Annual physical exam   [facility] at [facility], Nyoka Cowden, MD   1 year ago Moderate episode of recurrent major depressive disorder [facility])   [place] [facility] at [facility], Nyoka Cowden, MD   1 year ago Acute non-recurrent sinusitis, unspecified location   [facility] at [facility], Nyoka Cowden, MD   2 years ago Acute non-recurrent sinusitis, unspecified location   [facility] at [facility], Nyoka Cowden, MD

## 2023-07-21 ENCOUNTER — Ambulatory Visit: Payer: BLUE CROSS/BLUE SHIELD

## 2023-07-21 DIAGNOSIS — R110 Nausea: Secondary | ICD-10-CM | POA: Diagnosis not present

## 2023-07-21 DIAGNOSIS — Z1211 Encounter for screening for malignant neoplasm of colon: Secondary | ICD-10-CM | POA: Diagnosis not present

## 2023-07-21 DIAGNOSIS — K5730 Diverticulosis of large intestine without perforation or abscess without bleeding: Secondary | ICD-10-CM | POA: Diagnosis not present

## 2023-07-21 DIAGNOSIS — R1310 Dysphagia, unspecified: Secondary | ICD-10-CM | POA: Diagnosis not present

## 2023-07-21 DIAGNOSIS — D120 Benign neoplasm of cecum: Secondary | ICD-10-CM | POA: Diagnosis not present

## 2023-07-21 DIAGNOSIS — K640 First degree hemorrhoids: Secondary | ICD-10-CM | POA: Diagnosis not present

## 2023-07-21 DIAGNOSIS — D122 Benign neoplasm of ascending colon: Secondary | ICD-10-CM | POA: Diagnosis not present

## 2023-07-21 DIAGNOSIS — K200 Eosinophilic esophagitis: Secondary | ICD-10-CM | POA: Diagnosis not present

## 2023-08-11 DIAGNOSIS — R1310 Dysphagia, unspecified: Secondary | ICD-10-CM | POA: Diagnosis not present

## 2023-08-11 DIAGNOSIS — K200 Eosinophilic esophagitis: Secondary | ICD-10-CM | POA: Diagnosis not present

## 2023-08-11 DIAGNOSIS — K219 Gastro-esophageal reflux disease without esophagitis: Secondary | ICD-10-CM | POA: Diagnosis not present

## 2023-08-11 DIAGNOSIS — K581 Irritable bowel syndrome with constipation: Secondary | ICD-10-CM | POA: Diagnosis not present

## 2023-08-18 DIAGNOSIS — J0100 Acute maxillary sinusitis, unspecified: Secondary | ICD-10-CM | POA: Diagnosis not present

## 2023-10-04 ENCOUNTER — Encounter: Payer: Self-pay | Admitting: Internal Medicine

## 2023-10-04 ENCOUNTER — Ambulatory Visit: Payer: BLUE CROSS/BLUE SHIELD | Admitting: Internal Medicine

## 2023-10-04 VITALS — BP 122/72 | HR 91 | Ht 68.0 in | Wt 204.0 lb

## 2023-10-04 DIAGNOSIS — L299 Pruritus, unspecified: Secondary | ICD-10-CM | POA: Insufficient documentation

## 2023-10-04 DIAGNOSIS — N3000 Acute cystitis without hematuria: Secondary | ICD-10-CM

## 2023-10-04 LAB — POCT URINALYSIS DIPSTICK
Bilirubin, UA: NEGATIVE
Glucose, UA: NEGATIVE
Ketones, UA: NEGATIVE
Nitrite, UA: NEGATIVE
Protein, UA: NEGATIVE
Spec Grav, UA: 1.02 (ref 1.010–1.025)
Urobilinogen, UA: 0.2 U/dL
pH, UA: 5 (ref 5.0–8.0)

## 2023-10-04 MED ORDER — CEFUROXIME AXETIL 250 MG PO TABS: 250.0000 mg | ORAL_TABLET | Freq: Two times a day (BID) | ORAL | 0 refills | Status: DC

## 2023-10-04 MED ORDER — TRIAMCINOLONE ACETONIDE 0.1 % EX CREA: 1.0000 | TOPICAL_CREAM | Freq: Two times a day (BID) | CUTANEOUS | 0 refills | Status: AC

## 2023-10-04 NOTE — Progress Notes (Signed)
Date:  10/04/2023   Name:  Shannon Stanton   DOB:  03/24/1966   MRN:  [id number]   Chief Complaint: Urinary Tract Infection (Started Sunday with lower back pain. Burning while urinating.) and Rash (Rash on left arm. Itches and hurts. Started years ago. )  Urinary Tract Infection  This is a new problem. The current episode started in the past 7 days. The quality of the pain is described as burning. The pain is mild. There has been no fever. Associated symptoms include frequency and urgency. Pertinent negatives include no chills, flank pain, hematuria, hesitancy or possible pregnancy.  Rash This is a recurrent problem. The rash is diffuse (esp scalp, arms and back). The rash is characterized by redness, itchiness and burning. She was exposed to nothing. Pertinent negatives include no cough, fatigue, fever or shortness of breath. Past treatments include antihistamine (occasionally takes Allegra).    Review of Systems  Constitutional:  Negative for chills, fatigue and fever.  Respiratory:  Negative for cough, chest tightness, shortness of breath and wheezing.   Cardiovascular:  Negative for chest pain.  Genitourinary:  Positive for frequency and urgency. Negative for flank pain, hematuria and hesitancy.  Skin:  Positive for rash.  Psychiatric/Behavioral:  Negative for dysphoric mood and sleep disturbance. The patient is not nervous/anxious.      Lab Results  Component Value Date   NA 139 03/03/2023   K 5.0 03/03/2023   CO2 21 03/03/2023   GLUCOSE 95 03/03/2023   BUN 17 03/03/2023   CREATININE 0.96 03/03/2023   CALCIUM 9.2 03/03/2023   EGFR 70 03/03/2023   GFRNONAA 58 (L) 05/10/2019   Lab Results  Component Value Date   CHOL 214 (H) 03/03/2023   HDL 82 03/03/2023   LDLCALC 108 (H) 03/03/2023   TRIG 141 03/03/2023   CHOLHDL 2.6 03/03/2023   Lab Results  Component Value Date   TSH 1.410 03/03/2023   Lab Results  Component Value Date   HGBA1C 5.6 03/03/2023   Lab Results   Component Value Date   WBC 6.6 03/03/2023   HGB 14.7 03/03/2023   HCT 43.2 03/03/2023   MCV 93 03/03/2023   PLT 220 03/03/2023   Lab Results  Component Value Date   ALT 24 03/03/2023   AST 27 03/03/2023   ALKPHOS 94 03/03/2023   BILITOT 0.5 03/03/2023   No results found for: "25OHVITD2", "25OHVITD3", "VD25OH"   Patient Active Problem List   Diagnosis Date Noted   Pruritus 10/04/2023   Moderate episode of recurrent major depressive disorder (HCC) 09/23/2021   Status post hysterectomy 12/10/2020   Dietary B12 deficiency 06/20/2019   Menopausal symptoms 10/11/2017   Tobacco use disorder, mild, in sustained remission 09/30/2015   Attention deficit disorder with hyperactivity 09/30/2015   Asthma, mild intermittent 09/30/2015   Acid reflux 09/30/2015   Rosacea 09/30/2015   Arthralgia of temporomandibular joint 09/30/2015    Allergies  Allergen Reactions   Augmentin [Amoxicillin-Pot Clavulanate] Other (See Comments)    Liver shuts down   Meloxicam Shortness Of Breath   Hydrocodone Rash   Penicillins Rash    Past Surgical History:  Procedure Laterality Date   ABDOMINAL HYSTERECTOMY  2011   endometrial dysplasia   AUGMENTATION MAMMAPLASTY Bilateral 2010   OOPHORECTOMY Right 2011   TUBAL LIGATION      Social History   Tobacco Use   Smoking status: Former    Current packs/day: 0.00    Types: Cigarettes    Quit date:  05/2017    Years since quitting: 6.4   Smokeless tobacco: Never  Vaping Use   Vaping status: Every Day  Substance Use Topics   Alcohol use: Yes    Alcohol/week: 2.0 standard drinks of alcohol    Types: 2 Standard drinks or equivalent per week     Medication list has been reviewed and updated.  Current Meds  Medication Sig   albuterol (VENTOLIN HFA) 108 (90 Base) MCG/ACT inhaler TAKE 2 PUFFS BY MOUTH EVERY 6 HOURS AS NEEDED FOR WHEEZE OR SHORTNESS OF BREATH   cefUROXime (CEFTIN) 250 MG tablet Take 1 tablet (250 mg total) by mouth 2 (two) times  daily with a meal for 7 days.   dextroamphetamine (DEXEDRINE SPANSULE) 15 MG 24 hr capsule Take 15 mg by mouth daily.   DULERA 200-5 MCG/ACT AERO INHALE 2 PUFFS INTO THE LUNGS TWICE A DAY   fexofenadine (ALLEGRA) 30 MG tablet Take 30 mg by mouth 2 (two) times daily.   metroNIDAZOLE (METROGEL) 1 % gel Apply topically daily. To face   pantoprazole (PROTONIX) 40 MG tablet TAKE 1 TABLET BY MOUTH EVERY DAY   triamcinolone cream (KENALOG) 0.1 % Apply 1 Application topically 2 (two) times daily. To rash on arms, neck and back   [DISCONTINUED] buPROPion (WELLBUTRIN XL) 150 MG 24 hr tablet Take 1 tablet (150 mg total) by mouth daily.   [DISCONTINUED] linaclotide (LINZESS) 72 MCG capsule Take 72 mcg by mouth daily before breakfast.       10/04/2023    2:11 PM 03/03/2023   10:49 AM 11/23/2021   10:01 AM 10/23/2021    1:43 PM  GAD 7 : Generalized Anxiety Score  Nervous, Anxious, on Edge 0 1 1 0  Control/stop worrying 0 0 0 0  Worry too much - different things 0 1 0 0  Trouble relaxing 0 0 1 0  Restless 0 0 0 0  Easily annoyed or irritable 0 0 0 0  Afraid - awful might happen 0 0 0 0  Total GAD 7 Score 0 2 2 0  Anxiety Difficulty Not difficult at all Not difficult at all Not difficult at all Not difficult at all       10/04/2023    2:11 PM 03/03/2023   10:49 AM 11/23/2021   10:00 AM  Depression screen PHQ 2/9  Decreased Interest 1 3 0  Down, Depressed, Hopeless 1 1 0  PHQ - 2 Score 2 4 0  Altered sleeping 2 2 0  Tired, decreased energy 2 3 0  Change in appetite 1 1 0  Feeling bad or failure about yourself  0 0 1  Trouble concentrating 0 1 1  Moving slowly or fidgety/restless 0 0 0  Suicidal thoughts 0 0 0  PHQ-9 Score 7 11 2   Difficult doing work/chores Not difficult at all Not difficult at all Somewhat difficult    BP Readings from Last 3 Encounters:  10/04/23 122/72  03/03/23 118/78  11/23/21 110/76    Physical Exam Vitals and nursing note reviewed.  Constitutional:       General: She is not in acute distress.    Appearance: Normal appearance. She is well-developed.  HENT:     Head: Normocephalic and atraumatic.  Cardiovascular:     Rate and Rhythm: Normal rate and regular rhythm.  Pulmonary:     Effort: Pulmonary effort is normal. No respiratory distress.     Breath sounds: No wheezing or rhonchi.  Abdominal:     General: Abdomen  is flat.     Palpations: Abdomen is soft.     Tenderness: There is no abdominal tenderness. There is no right CVA tenderness or left CVA tenderness.  Skin:    General: Skin is warm and dry.     Findings: No rash.       Neurological:     Mental Status: She is alert and oriented to person, place, and time.  Psychiatric:        Mood and Affect: Mood normal.        Behavior: Behavior normal.    Lab Results  Component Value Date   COLORU yellow 10/04/2023   CLARITYU cloudy 10/04/2023   GLUCOSEUR Negative 10/04/2023   BILIRUBINUR neg 10/04/2023   KETONESU neg 10/04/2023   SPECGRAV 1.020 10/04/2023   RBCUR large 3+ 10/04/2023   PHUR 5.0 10/04/2023   PROTEINUR Negative 10/04/2023   UROBILINOGEN 0.2 10/04/2023   LEUKOCYTESUR Large (3+) (A) 10/04/2023     Wt Readings from Last 3 Encounters:  10/04/23 204 lb (92.5 kg)  03/03/23 201 lb 6.4 oz (91.4 kg)  11/23/21 197 lb (89.4 kg)    BP 122/72   Pulse 91   Ht 5\' 8"  (1.727 m)   Wt 204 lb (92.5 kg)   SpO2 96%   BMI 31.02 kg/m   Assessment and Plan:  Problem List Items Addressed This Visit       Unprioritized   Pruritus    Recommend TAC ointment PRN Avoid scratching      Relevant Medications   triamcinolone cream (KENALOG) 0.1 %   Other Visit Diagnoses     Acute cystitis without hematuria    -  Primary   push fluids; AZO if needed for discomfort   Relevant Medications   cefUROXime (CEFTIN) 250 MG tablet   Other Relevant Orders   POCT urinalysis dipstick (Completed)       Return in about 6 months (around 04/03/2024) for CPX.    Reubin Milan, MD [facility] [place]

## 2023-10-04 NOTE — Assessment & Plan Note (Signed)
Recommend TAC ointment PRN Avoid scratching

## 2023-10-05 ENCOUNTER — Other Ambulatory Visit: Payer: Self-pay | Admitting: Internal Medicine

## 2023-10-05 DIAGNOSIS — F331 Major depressive disorder, recurrent, moderate: Secondary | ICD-10-CM

## 2023-10-06 NOTE — Telephone Encounter (Signed)
Requested Prescriptions  Pending Prescriptions Disp Refills   buPROPion (WELLBUTRIN XL) 150 MG 24 hr tablet [Pharmacy Med Name: BUPROPION HCL XL 150 MG TABLET] 90 tablet 1    Sig: TAKE 1 TABLET BY MOUTH EVERY DAY     Psychiatry: Antidepressants - bupropion Passed - 10/05/2023  4:05 AM      Passed - Cr in normal range and within 360 days    Creatinine, Ser  Date Value Ref Range Status  03/03/2023 0.96 0.57 - 1.00 mg/dL Final         Passed - AST in normal range and within 360 days    AST  Date Value Ref Range Status  03/03/2023 27 0 - 40 IU/L Final         Passed - ALT in normal range and within 360 days    ALT  Date Value Ref Range Status  03/03/2023 24 0 - 32 IU/L Final         Passed - Completed PHQ-2 or PHQ-9 in the last 360 days      Passed - Last BP in normal range    BP Readings from Last 1 Encounters:  10/04/23 122/72         Passed - Valid encounter within last 6 months    Recent Outpatient Visits           2 days ago Acute cystitis without hematuria   [place] [facility] at [facility], Nyoka Cowden, MD   7 months ago Annual physical exam   [facility] at [facility], Nyoka Cowden, MD   1 year ago Annual physical exam   [facility] at [facility], Nyoka Cowden, MD   1 year ago Moderate episode of recurrent major depressive disorder [facility])   [place] [facility] Medicine at [facility], Nyoka Cowden, MD   2 years ago Acute non-recurrent sinusitis, unspecified location   [facility] & [facility] Medicine at [facility], Nyoka Cowden, MD       Future Appointments             In 6 months Judithann Graves, Nyoka Cowden, MD [facility] Primary Care & Sports Medicine at [facility], [facility]

## 2023-10-11 ENCOUNTER — Ambulatory Visit: Payer: Self-pay | Admitting: *Deleted

## 2023-10-11 ENCOUNTER — Other Ambulatory Visit: Payer: Self-pay | Admitting: Internal Medicine

## 2023-10-11 DIAGNOSIS — N3000 Acute cystitis without hematuria: Secondary | ICD-10-CM

## 2023-10-11 MED ORDER — NITROFURANTOIN MONOHYD MACRO 100 MG PO CAPS: 100.0000 mg | ORAL_CAPSULE | Freq: Two times a day (BID) | ORAL | 0 refills | Status: AC

## 2023-10-11 NOTE — Telephone Encounter (Signed)
Called and left VM informing patient of change in medicine.

## 2023-10-11 NOTE — Telephone Encounter (Signed)
  Chief Complaint: worsening urinary sx since starting antibiotics for UTI Symptoms: right flank pain right low abdominal pain back pain. Burning with urination. Started antibiotic ceftin 10/04/23. Continues with pain. Frequency: 10/04/23 Pertinent Negatives: Patient denies fever no blood in urine  Disposition: [] ED /[] Urgent Care (no appt availability in office) / [] Appointment(In office/virtual)/ []  [place] [facility]/ [] Home Care/ [] Refused Recommended Disposition /[] [place] [facility]/ [x]  Follow-up with PCP Additional Notes:   No available appt with PCP until 10/13/23. Patient would like to know if she needs to come in to submit a sample PCP was unable to get accurate amount "last time". Please advise patient at work but can come in to submit sample if needed.         Reason for Disposition  [1] Taking antibiotic > 24 hours for UTI AND [2] flank or lower back pain getting WORSE  Answer Assessment - Initial Assessment Questions 1. MAIN SYMPTOM: "What is the main symptom you are concerned about?" (e.g., painful urination, urine frequency)     Not getting better after antibiotics urinary pain right low abdomen, right back burning urination 2. BETTER-SAME-WORSE: "Are you getting better, staying the same, or getting worse compared to how you felt at your last visit to the doctor (most recent medical visit)?"     worse 3. PAIN: "How bad is the pain?"  (e.g., Scale 1-10; mild, moderate, or severe)   - MILD (1-3): complains slightly about urination hurting   - MODERATE (4-7): interferes with normal activities     - SEVERE (8-10): excruciating, unwilling or unable to urinate because of the pain      Moderate worked from home  4. FEVER: "Do you have a fever?" If Yes, ask: "What is it, how was it measured, and when did it start?"     na 5. OTHER SYMPTOMS: "Do you have any other symptoms?" (e.g., blood in the urine, flank pain, vaginal discharge)     Right flank pain , right back  pain, pain with urination burning. 6. DIAGNOSIS: "When was the UTI diagnosed?" "By whom?" "Was it a kidney infection, bladder infection or both?"     Yes , PCP.  7. ANTIBIOTIC: "What antibiotic(s) are you taking?" "How many times per day?"     Ceftin 250 mg 2 times daily with meals 8. ANTIBIOTIC - START DATE: "When did you start taking the antibiotic?"     10/04/23  Protocols used: Urinary Tract Infection on Antibiotic Follow-up Call - [facility]

## 2023-10-29 IMAGING — MG MM  DIGITAL DIAGNOSTIC BREAST BILAT IMPLANT W/ TOMO W/ CAD
8 of 16 series · 8 of 40 positions shown · non-contrast
Comparison: Previous exam(s).

CLINICAL DATA: Nonfocal bilateral breast pain.

EXAM:
DIGITAL DIAGNOSTIC BILATERAL MAMMOGRAM WITH IMPLANTS, CAD AND
TOMOSYNTHESIS
TECHNIQUE: Bilateral digital diagnostic mammography and breast tomosynthesis
was performed. The images were evaluated with computer-aided
detection. Standard and/or implant displaced views were performed.

[R CC]
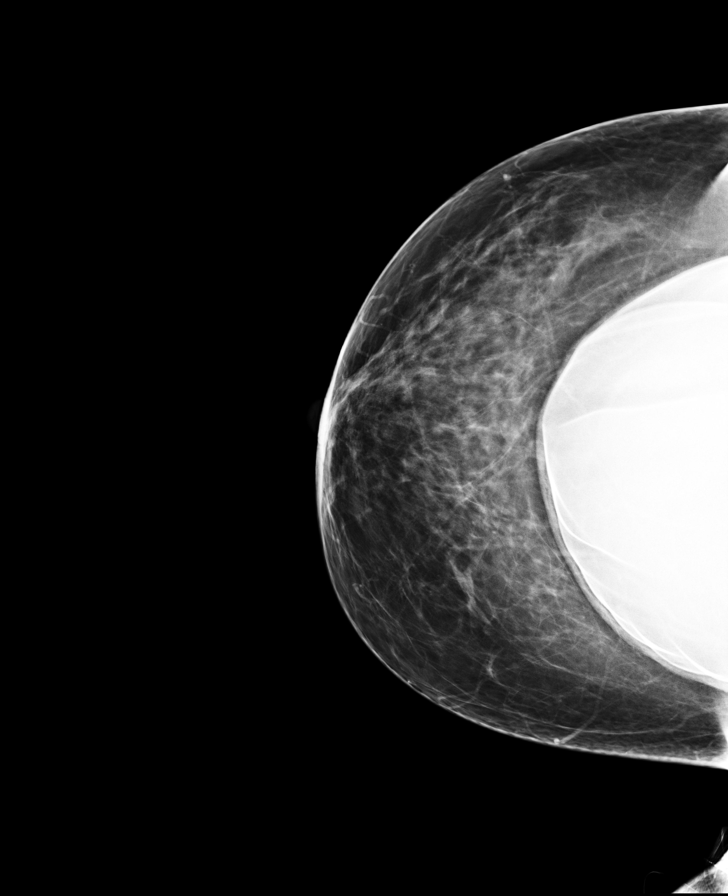

[L CC]
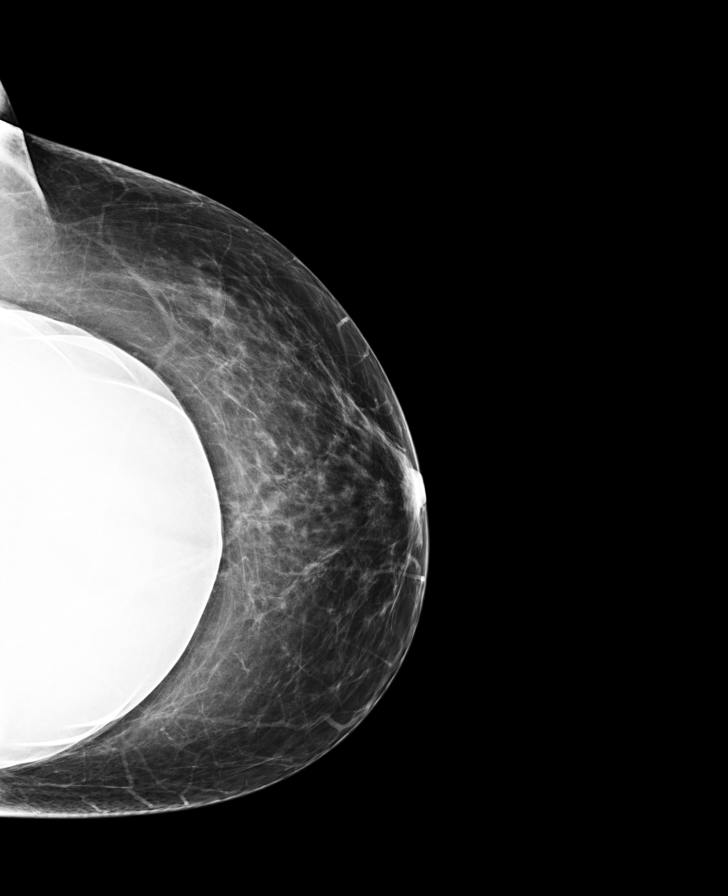

[L MLO]
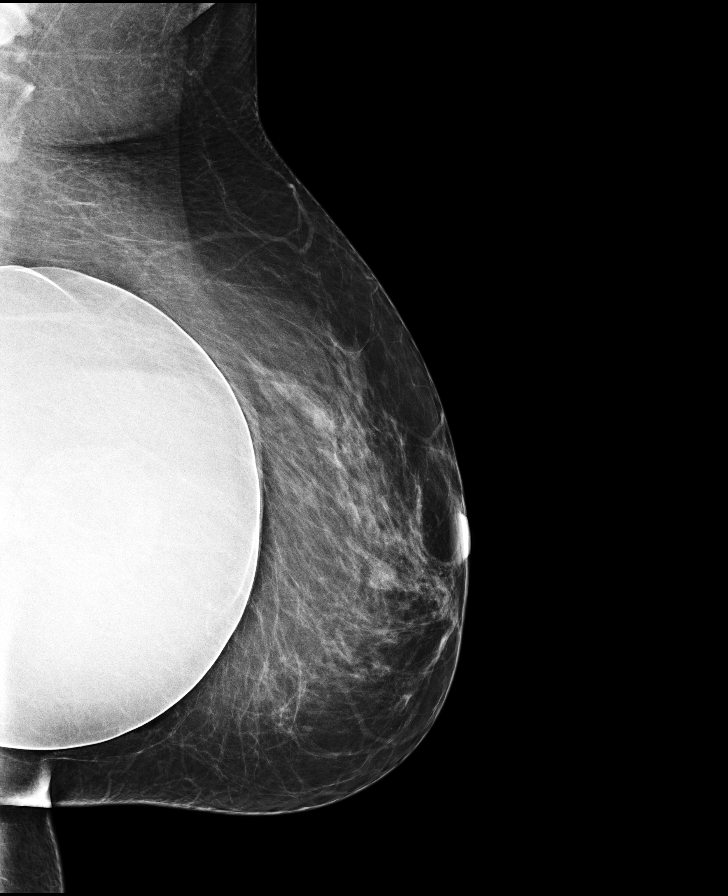

[R MLO]
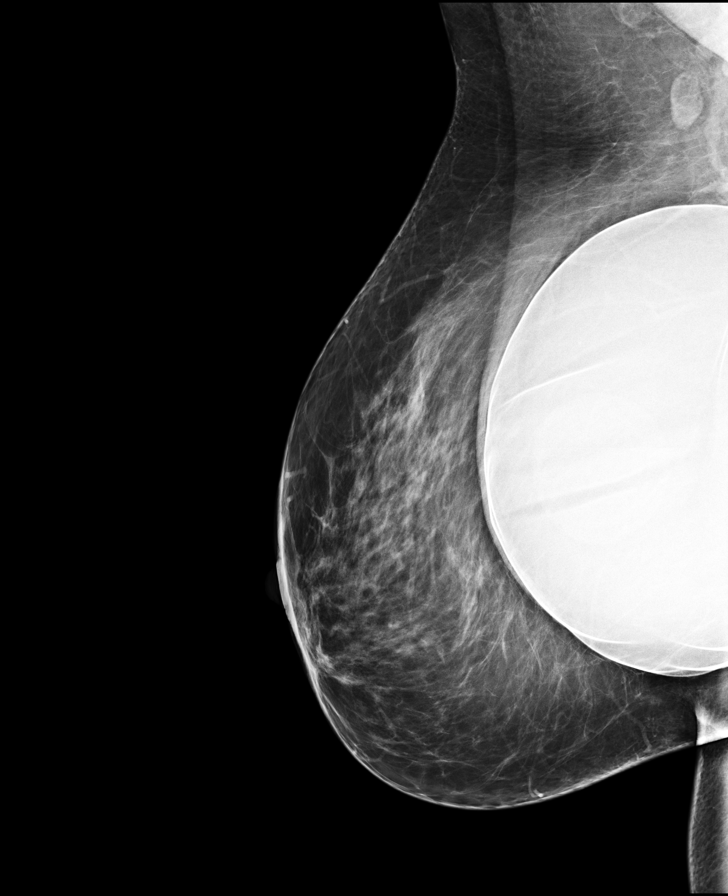

[L MLO synth-2D]
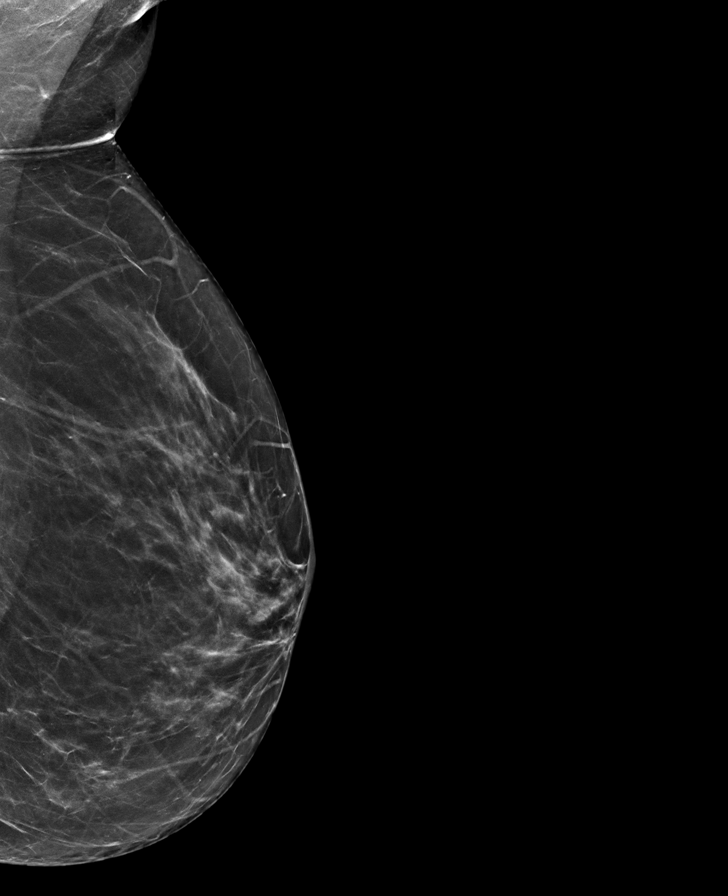

[L CC synth-2D]
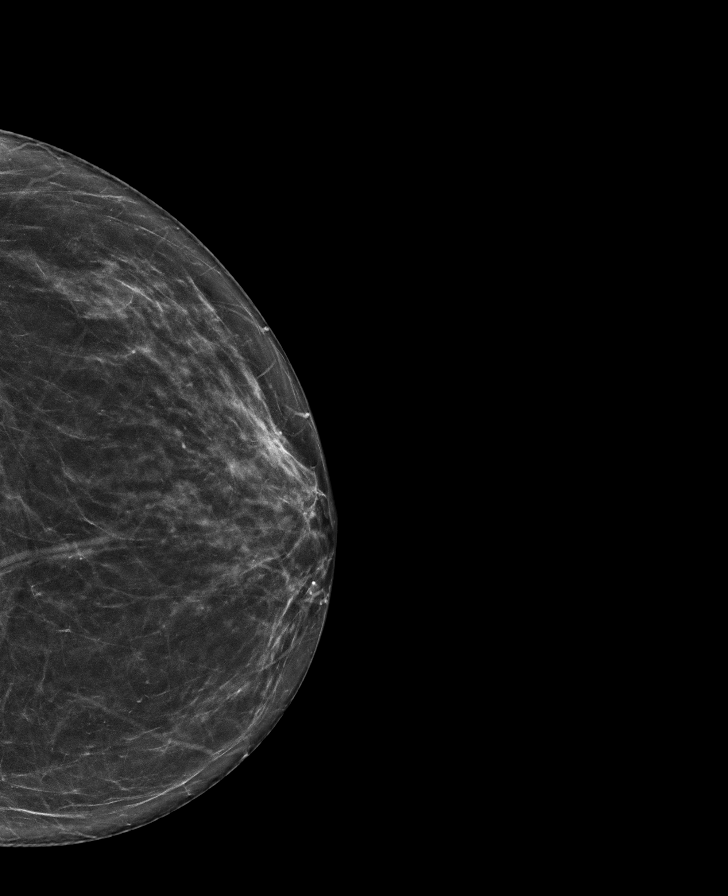

[R CC synth-2D]
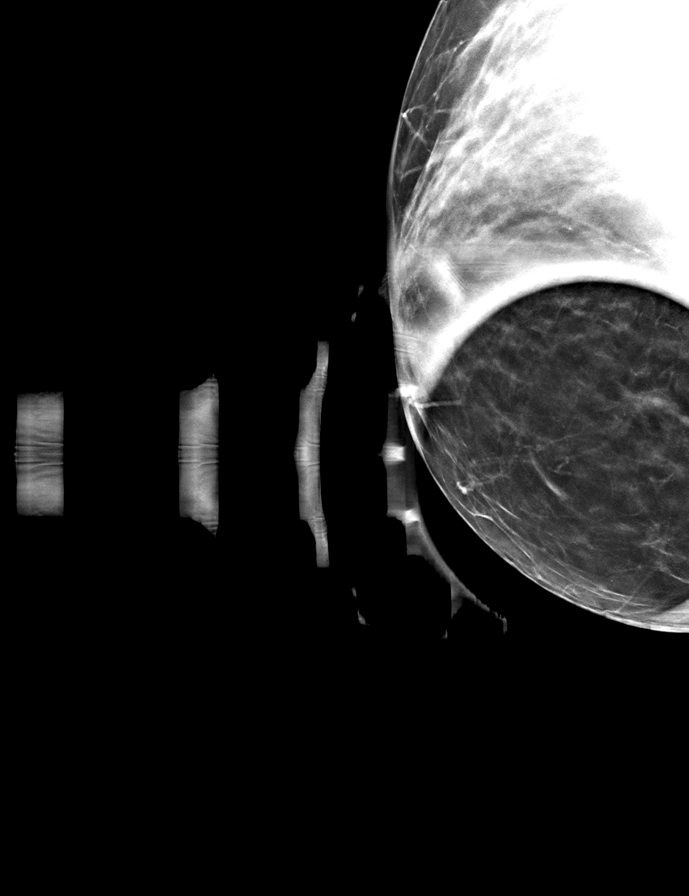

[R MLO synth-2D]
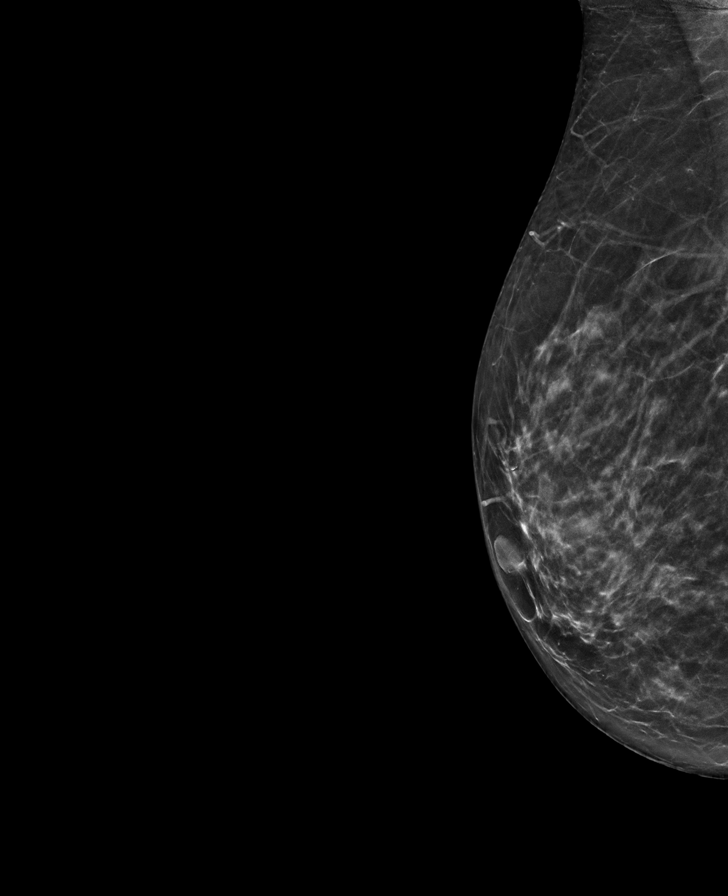

[8 of 40 positions shown; findings below may reference images not displayed]

ACR Breast Density Category b: There are scattered areas of
fibroglandular density.
FINDINGS: No suspicious masses, calcifications, or distortion identified in
either breast. The patient has retropectoral implants.
IMPRESSION: No mammographic evidence of malignancy.

RECOMMENDATION:
Treatment of the patient's symptoms should be based on clinical and
physical exam given lack of imaging findings. Recommend annual
screening mammography.

I have discussed the findings and recommendations with the patient.
If applicable, a reminder letter will be sent to the patient
regarding the next appointment.

BI-RADS CATEGORY  1: Negative.

## 2023-11-04 ENCOUNTER — Other Ambulatory Visit: Payer: Self-pay | Admitting: Internal Medicine

## 2023-11-04 DIAGNOSIS — J4520 Mild intermittent asthma, uncomplicated: Secondary | ICD-10-CM

## 2023-11-04 NOTE — Telephone Encounter (Signed)
 Medication Refill -  Most Recent Primary Care Visit:  Provider: BERGLUND, LAURA H  Department: [facility] [place]  Visit Type: OFFICE VISIT  Date: 10/04/2023  Medication: albuterol  (VENTOLIN  HFA) 108 (90 Base) MCG/ACT inhaler and DULERA  200-5 MCG/ACT AERO   Has the patient contacted their pharmacy? No  Is this the correct pharmacy for this prescription? Yes  This is the patient's preferred pharmacy:  [facility] [phone] [place], [place] - [address] [place] [address] Phone: [phone] Fax: [phone]   Has the prescription been filled recently? Yes  Is the patient out of the medication? Yes  Has the patient been seen for an appointment in the last year OR does the patient have an upcoming appointment? Yes  Can we respond through MyChart? No  Agent: Please be advised that Rx refills may take up to 3 business days. We ask that you follow-up with your pharmacy.

## 2023-11-08 MED ORDER — DULERA 200-5 MCG/ACT IN AERO: INHALATION_SPRAY | RESPIRATORY_TRACT | 1 refills | Status: DC

## 2023-11-08 MED ORDER — ALBUTEROL SULFATE HFA 108 (90 BASE) MCG/ACT IN AERS: INHALATION_SPRAY | RESPIRATORY_TRACT | 0 refills | Status: DC

## 2023-11-08 NOTE — Telephone Encounter (Signed)
 Patient requesting refills. Future visit in 4 months.  Requested Prescriptions  Pending Prescriptions Disp Refills   albuterol  (VENTOLIN  HFA) 108 (90 Base) MCG/ACT inhaler 8.5 each 0    Sig: TAKE 2 PUFFS BY MOUTH EVERY 6 HOURS AS NEEDED FOR WHEEZE OR SHORTNESS OF BREATH     Pulmonology:  Beta Agonists 2 Passed - 11/08/2023  9:30 AM      Passed - Last BP in normal range    BP Readings from Last 1 Encounters:  10/04/23 122/72         Passed - Last Heart Rate in normal range    Pulse Readings from Last 1 Encounters:  10/04/23 91         Passed - Valid encounter within last 12 months    Recent Outpatient Visits           1 month ago Acute cystitis without hematuria   [place] [facility] at [facility], Leita DEL, MD   8 months ago Annual physical exam   [facility] at [facility], Leita DEL, MD   1 year ago Annual physical exam   [facility] at [facility], Leita DEL, MD   2 years ago Moderate episode of recurrent major depressive disorder [facility])   [place] [facility] at [facility], Leita DEL, MD   2 years ago Acute non-recurrent sinusitis, unspecified location   [facility] Primary Care & Sports Medicine at [facility], Leita DEL, MD       Future Appointments             In 4 months Justus Leita DEL, MD [place] [facility] Medicine at [facility] [place], [facility]             DULERA  200-5 MCG/ACT AERO 13 g 1    Sig: INHALE 2 PUFFS INTO THE LUNGS TWICE A DAY     Pulmonology:  Combination Products Passed - 11/08/2023  9:30 AM      Passed - Valid encounter within last 12 months    Recent Outpatient Visits           1 month ago Acute cystitis without hematuria   [place] [facility] at [facility], Leita DEL, MD   8 months ago Annual physical exam   [facility]  Primary Care & Sports Medicine at [facility], Leita DEL, MD   1 year ago Annual physical exam   [facility] Primary Care & Sports Medicine at [facility], Leita DEL, MD   2 years ago Moderate episode of recurrent major depressive disorder [facility])    Primary Care & Sports Medicine at [facility], Leita DEL, MD   2 years ago Acute non-recurrent sinusitis, unspecified location   [facility] Primary Care & Sports Medicine at [facility], Leita DEL, MD       Future Appointments             In 4 months Justus, Leita DEL, MD [facility] Primary Care & Sports Medicine at [facility], [facility]

## 2024-02-12 ENCOUNTER — Other Ambulatory Visit: Payer: Self-pay | Admitting: Internal Medicine

## 2024-02-12 DIAGNOSIS — J4520 Mild intermittent asthma, uncomplicated: Secondary | ICD-10-CM

## 2024-02-14 NOTE — Telephone Encounter (Signed)
 Requested Prescriptions  Pending Prescriptions Disp Refills   albuterol (VENTOLIN HFA) 108 (90 Base) MCG/ACT inhaler [Pharmacy Med Name: VENTOLIN HFA 90 MCG INHALER] 18 each 0    Sig: TAKE 2 PUFFS BY MOUTH EVERY 6 HOURS AS NEEDED FOR WHEEZE OR SHORTNESS OF BREATH     Pulmonology:  Beta Agonists 2 Failed - 02/14/2024  9:21 AM      Failed - Valid encounter within last 12 months    Recent Outpatient Visits   None     Future Appointments             In 1 month Gala Jubilee, [place]ales [place], MD [facility] Primary Care & Sports Medicine at [facility], [facility]            Passed - Last BP in normal range    BP Readings from Last 1 Encounters:  10/04/23 122/72         Passed - Last Heart Rate in normal range    Pulse Readings from Last 1 Encounters:  10/04/23 91

## 2024-02-14 NOTE — Telephone Encounter (Signed)
 Please review.  KP

## 2024-02-14 NOTE — Telephone Encounter (Signed)
 Requested medication (s) are due for refill today:  Requested medication (s) are on the active medication list: Yes  Last refill:    Future visit scheduled: Yes  Notes to clinic:  See pharmacy request.    Requested Prescriptions  Pending Prescriptions Disp Refills   ADVAIR HFA 230-21 MCG/ACT inhaler [Pharmacy Med Name: ADVAIR HFA 230-21 MCG INHALER]  0     Pulmonology:  Combination Products Failed - 02/14/2024  9:31 AM      Failed - Valid encounter within last 12 months    Recent Outpatient Visits   None     Future Appointments             In 1 month Gala Jubilee, Chales [place], MD [facility] Primary Care & Sports Medicine at [facility], [facility]            pc

## 2024-02-20 ENCOUNTER — Ambulatory Visit: Admitting: Internal Medicine

## 2024-02-20 ENCOUNTER — Encounter: Payer: Self-pay | Admitting: Internal Medicine

## 2024-02-20 VITALS — BP 112/80 | HR 96 | Temp 98.0°F | Ht 68.0 in | Wt 207.0 lb

## 2024-02-20 DIAGNOSIS — J0100 Acute maxillary sinusitis, unspecified: Secondary | ICD-10-CM | POA: Diagnosis not present

## 2024-02-20 DIAGNOSIS — J029 Acute pharyngitis, unspecified: Secondary | ICD-10-CM

## 2024-02-20 DIAGNOSIS — L209 Atopic dermatitis, unspecified: Secondary | ICD-10-CM | POA: Diagnosis not present

## 2024-02-20 LAB — POCT RAPID STREP A (OFFICE): Rapid Strep A Screen: NEGATIVE

## 2024-02-20 MED ORDER — AZITHROMYCIN 250 MG PO TABS: ORAL_TABLET | ORAL | 0 refills | Status: AC

## 2024-02-20 MED ORDER — EUCRISA 2 % EX OINT: 1.0000 | TOPICAL_OINTMENT | Freq: Every day | CUTANEOUS | 0 refills | Status: AC

## 2024-02-20 NOTE — Assessment & Plan Note (Signed)
 No relief from topical steroids Will refer to Dermatology; trial of Eucrisa  if affordable

## 2024-02-20 NOTE — Progress Notes (Signed)
 Date:  02/20/2024   Name:  Shannon Stanton   DOB:  09/28/1966   MRN:  [id number]   Chief Complaint: Sore Throat (Friday night, sore throat left side of throat, low grade fever), Headache, and Ear Pain  Sore Throat  This is a new problem. Episode onset: three days ago. The problem has been unchanged. There has been no fever. Associated symptoms include headaches and trouble swallowing. Pertinent negatives include no congestion, coughing, ear discharge, shortness of breath or swollen glands.  Headache  Associated symptoms include sinus pressure and a sore throat. Pertinent negatives include no coughing, fever or swollen glands.  Rash This is a chronic problem. The problem has been gradually worsening since onset. The affected locations include the torso, right arm and left arm. Associated symptoms include fatigue and a sore throat. Pertinent negatives include no congestion, cough, fever or shortness of breath.    Review of Systems  Constitutional:  Positive for fatigue. Negative for chills and fever.  HENT:  Positive for sinus pressure, sore throat and trouble swallowing. Negative for congestion and ear discharge.   Respiratory:  Negative for cough and shortness of breath.   Cardiovascular:  Negative for chest pain and palpitations.  Skin:  Positive for rash.  Neurological:  Positive for headaches.  Psychiatric/Behavioral:  Positive for sleep disturbance. Negative for dysphoric mood. The patient is not nervous/anxious.      Lab Results  Component Value Date   NA 139 03/03/2023   K 5.0 03/03/2023   CO2 21 03/03/2023   GLUCOSE 95 03/03/2023   BUN 17 03/03/2023   CREATININE 0.96 03/03/2023   CALCIUM 9.2 03/03/2023   EGFR 70 03/03/2023   GFRNONAA 58 (L) 05/10/2019   Lab Results  Component Value Date   CHOL 214 (H) 03/03/2023   HDL 82 03/03/2023   LDLCALC 108 (H) 03/03/2023   TRIG 141 03/03/2023   CHOLHDL 2.6 03/03/2023   Lab Results  Component Value Date   TSH 1.410  03/03/2023   Lab Results  Component Value Date   HGBA1C 5.6 03/03/2023   Lab Results  Component Value Date   WBC 6.6 03/03/2023   HGB 14.7 03/03/2023   HCT 43.2 03/03/2023   MCV 93 03/03/2023   PLT 220 03/03/2023   Lab Results  Component Value Date   ALT 24 03/03/2023   AST 27 03/03/2023   ALKPHOS 94 03/03/2023   BILITOT 0.5 03/03/2023   No results found for: "25OHVITD2", "25OHVITD3", "VD25OH"   Patient Active Problem List   Diagnosis Date Noted   Atopic dermatitis in adult 02/20/2024   Pruritus 10/04/2023   Moderate episode of recurrent major depressive disorder (HCC) 09/23/2021   Status post hysterectomy 12/10/2020   Dietary B12 deficiency 06/20/2019   Menopausal symptoms 10/11/2017   Tobacco use disorder, mild, in sustained remission 09/30/2015   Attention deficit disorder with hyperactivity 09/30/2015   Asthma, mild intermittent 09/30/2015   Acid reflux 09/30/2015   Rosacea 09/30/2015   Arthralgia of temporomandibular joint 09/30/2015    Allergies  Allergen Reactions   Augmentin [Amoxicillin-Pot Clavulanate] Other (See Comments)    Liver shuts down   Meloxicam Shortness Of Breath   Hydrocodone Rash   Penicillins Rash    Past Surgical History:  Procedure Laterality Date   ABDOMINAL HYSTERECTOMY  2011   endometrial dysplasia   AUGMENTATION MAMMAPLASTY Bilateral 2010   OOPHORECTOMY Right 2011   TUBAL LIGATION      Social History   Tobacco Use  Smoking status: Former    Current packs/day: 0.00    Types: Cigarettes    Quit date: 05/2017    Years since quitting: 6.8   Smokeless tobacco: Never  Vaping Use   Vaping status: Every Day  Substance Use Topics   Alcohol use: Yes    Alcohol/week: 2.0 standard drinks of alcohol    Types: 2 Standard drinks or equivalent per week     Medication list has been reviewed and updated.  Current Meds  Medication Sig   albuterol  (VENTOLIN  HFA) 108 (90 Base) MCG/ACT inhaler TAKE 2 PUFFS BY MOUTH EVERY 6 HOURS  AS NEEDED FOR WHEEZE OR SHORTNESS OF BREATH   azithromycin  (ZITHROMAX  Z-PAK) 250 MG tablet UAD   Crisaborole  (EUCRISA ) 2 % OINT Apply 1 Application topically daily.   dextroamphetamine (DEXEDRINE SPANSULE) 15 MG 24 hr capsule Take 15 mg by mouth daily.   fexofenadine (ALLEGRA) 30 MG tablet Take 30 mg by mouth 2 (two) times daily.   fluticasone -salmeterol (ADVAIR HFA) 230-21 MCG/ACT inhaler Inhale 2 puffs into the lungs 2 (two) times daily.   metroNIDAZOLE  (METROGEL ) 1 % gel Apply topically daily. To face   pantoprazole  (PROTONIX ) 40 MG tablet TAKE 1 TABLET BY MOUTH EVERY DAY   triamcinolone  cream (KENALOG ) 0.1 % Apply 1 Application topically 2 (two) times daily. To rash on arms, neck and back       02/20/2024    2:29 PM 10/04/2023    2:11 PM 03/03/2023   10:49 AM 11/23/2021   10:01 AM  GAD 7 : Generalized Anxiety Score  Nervous, Anxious, on Edge 1 0 1 1  Control/stop worrying 0 0 0 0  Worry too much - different things 0 0 1 0  Trouble relaxing 0 0 0 1  Restless 0 0 0 0  Easily annoyed or irritable 0 0 0 0  Afraid - awful might happen 0 0 0 0  Total GAD 7 Score 1 0 2 2  Anxiety Difficulty  Not difficult at all Not difficult at all Not difficult at all       02/20/2024    2:29 PM 10/04/2023    2:11 PM 03/03/2023   10:49 AM  Depression screen PHQ 2/9  Decreased Interest 0 1 3  Down, Depressed, Hopeless 0 1 1  PHQ - 2 Score 0 2 4  Altered sleeping 1 2 2   Tired, decreased energy 2 2 3   Change in appetite 0 1 1  Feeling bad or failure about yourself  0 0 0  Trouble concentrating 1 0 1  Moving slowly or fidgety/restless 0 0 0  Suicidal thoughts 0 0 0  PHQ-9 Score 4 7 11   Difficult doing work/chores  Not difficult at all Not difficult at all    BP Readings from Last 3 Encounters:  02/20/24 112/80  10/04/23 122/72  03/03/23 118/78    Physical Exam Constitutional:      Appearance: She is well-developed.  HENT:     Right Ear: Ear canal and external ear normal. No middle ear  effusion. Tympanic membrane is injected. Tympanic membrane is not erythematous or retracted.     Left Ear: Ear canal and external ear normal.  No middle ear effusion. Tympanic membrane is not erythematous or retracted.     Nose:     Right Sinus: Frontal sinus tenderness present. No maxillary sinus tenderness.     Left Sinus: Frontal sinus tenderness present. No maxillary sinus tenderness.     Mouth/Throat:  Mouth: No oral lesions.     Pharynx: Uvula midline. Posterior oropharyngeal erythema present. No oropharyngeal exudate.  Eyes:     Conjunctiva/sclera: Conjunctivae normal.  Cardiovascular:     Rate and Rhythm: Normal rate and regular rhythm.     Heart sounds: Normal heart sounds.  Pulmonary:     Breath sounds: Normal breath sounds. No wheezing or rales.  Lymphadenopathy:     Cervical: Cervical adenopathy present.  Skin:    Findings: Rash present.     Comments: Scattered excoriations over arms and abdomen  Neurological:     Mental Status: She is alert and oriented to person, place, and time.     Wt Readings from Last 3 Encounters:  02/20/24 207 lb (93.9 kg)  10/04/23 204 lb (92.5 kg)  03/03/23 201 lb 6.4 oz (91.4 kg)    BP 112/80   Pulse 96   Temp 98 F (36.7 C) Comment: Took tylenol 1 hour ago  Ht 5\' 8"  (1.727 m)   Wt 207 lb (93.9 kg)   SpO2 96%   BMI 31.47 kg/m   Assessment and Plan:  Problem List Items Addressed This Visit       Unprioritized   Atopic dermatitis in adult   No relief from topical steroids Will refer to Dermatology; trial of Eucrisa  if affordable      Relevant Medications   Crisaborole  (EUCRISA ) 2 % OINT   Other Relevant Orders   Ambulatory referral to Dermatology   Other Visit Diagnoses       Acute non-recurrent maxillary sinusitis    -  Primary   Continue Flonase NS Tylenol or Advil for sore throat and HA   Relevant Medications   azithromycin  (ZITHROMAX  Z-PAK) 250 MG tablet     Sore throat       Strep negative   Relevant  Orders   POCT rapid strep A (Completed)       No follow-ups on file.    Sheron Dixons, MD [facility] [place]

## 2024-02-20 NOTE — Patient Instructions (Signed)
 Go to [email or web address] to download a savings card.

## 2024-03-12 ENCOUNTER — Encounter: Payer: Self-pay | Admitting: Internal Medicine

## 2024-03-12 ENCOUNTER — Ambulatory Visit: Admitting: Internal Medicine

## 2024-03-12 VITALS — BP 104/70 | HR 82 | Ht 68.0 in | Wt 205.1 lb

## 2024-03-12 DIAGNOSIS — T2125XS Burn of second degree of buttock, sequela: Secondary | ICD-10-CM | POA: Insufficient documentation

## 2024-03-12 DIAGNOSIS — T2125XA Burn of second degree of buttock, initial encounter: Secondary | ICD-10-CM | POA: Diagnosis not present

## 2024-03-12 MED ORDER — ONDANSETRON 4 MG PO TBDP: 4.0000 mg | ORAL_TABLET | Freq: Three times a day (TID) | ORAL | 0 refills | Status: AC | PRN

## 2024-03-12 NOTE — Assessment & Plan Note (Signed)
 Continue to apply Silvadene twice a day and cover loosely. Begin Bactrim  DS bid - take 30 min after Zofran Follow up in 10 days for re-check - will determine if she needs to see the wound clinic at that time.
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
## 2024-03-23 ENCOUNTER — Encounter: Payer: Self-pay | Admitting: Internal Medicine

## 2024-03-23 ENCOUNTER — Ambulatory Visit: Admitting: Internal Medicine

## 2024-03-23 VITALS — BP 118/72 | HR 98 | Ht 68.0 in | Wt 205.0 lb

## 2024-03-23 DIAGNOSIS — T2125XD Burn of second degree of buttock, subsequent encounter: Secondary | ICD-10-CM

## 2024-03-23 DIAGNOSIS — T2125XS Burn of second degree of buttock, sequela: Secondary | ICD-10-CM | POA: Diagnosis not present

## 2024-03-23 NOTE — Assessment & Plan Note (Signed)
 continue local care with Silvadene daily will re-evaluate at [facility] in about 2 weeks
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
## 2024-04-03 ENCOUNTER — Encounter: Payer: Self-pay | Admitting: Internal Medicine

## 2024-04-03 ENCOUNTER — Ambulatory Visit (INDEPENDENT_AMBULATORY_CARE_PROVIDER_SITE_OTHER): Payer: Self-pay | Admitting: Internal Medicine

## 2024-04-03 VITALS — BP 116/72 | HR 95 | Ht 68.0 in | Wt 205.0 lb

## 2024-04-03 DIAGNOSIS — Z131 Encounter for screening for diabetes mellitus: Secondary | ICD-10-CM | POA: Diagnosis not present

## 2024-04-03 DIAGNOSIS — T2125XS Burn of second degree of buttock, sequela: Secondary | ICD-10-CM | POA: Diagnosis not present

## 2024-04-03 DIAGNOSIS — Z1231 Encounter for screening mammogram for malignant neoplasm of breast: Secondary | ICD-10-CM | POA: Diagnosis not present

## 2024-04-03 DIAGNOSIS — K219 Gastro-esophageal reflux disease without esophagitis: Secondary | ICD-10-CM | POA: Diagnosis not present

## 2024-04-03 DIAGNOSIS — J4520 Mild intermittent asthma, uncomplicated: Secondary | ICD-10-CM

## 2024-04-03 DIAGNOSIS — Z0000 Encounter for general adult medical examination without abnormal findings: Secondary | ICD-10-CM

## 2024-04-03 DIAGNOSIS — E538 Deficiency of other specified B group vitamins: Secondary | ICD-10-CM

## 2024-04-03 DIAGNOSIS — E785 Hyperlipidemia, unspecified: Secondary | ICD-10-CM | POA: Insufficient documentation

## 2024-04-03 MED ORDER — ALBUTEROL SULFATE HFA 108 (90 BASE) MCG/ACT IN AERS: INHALATION_SPRAY | RESPIRATORY_TRACT | 1 refills | Status: AC

## 2024-04-03 MED ORDER — PANTOPRAZOLE SODIUM 40 MG PO TBEC
40.0000 mg | DELAYED_RELEASE_TABLET | Freq: Every day | ORAL | 0 refills | Status: DC
Start: 2024-04-03 — End: 2024-07-09

## 2024-04-03 NOTE — Assessment & Plan Note (Signed)
 Lesion appears healthy with pink edges and no slough Continue topical honey antibacterial and follow up if needed

## 2024-04-03 NOTE — Assessment & Plan Note (Signed)
 Will check levels; advise daily oral supplement

## 2024-04-03 NOTE — Assessment & Plan Note (Signed)
 Reflux symptoms are controlled on PRN pantoprazole . Patient denies red flag symptoms - no melena, weight loss, dysphagia.
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
## 2024-04-03 NOTE — Assessment & Plan Note (Signed)
 Asthma well controlled with daily inhaler. No recent flares or need for oral steroids. Recommend Prevnar 20

## 2024-04-03 NOTE — Assessment & Plan Note (Signed)
 LDL is  Lab Results  Component Value Date   LDLCALC 108 (H) 03/03/2023   Current regimen is diet only.

## 2024-04-03 NOTE — Patient Instructions (Signed)
 Call [facility] to schedule your mammogram at [phone].

## 2024-04-04 ENCOUNTER — Ambulatory Visit: Payer: Self-pay | Admitting: Internal Medicine

## 2024-04-04 LAB — CBC WITH DIFFERENTIAL/PLATELET
Basophils Absolute: 0.1 10*3/uL (ref 0.0–0.2)
Basos: 1 %
EOS (ABSOLUTE): 0.1 10*3/uL (ref 0.0–0.4)
Eos: 2 %
Hematocrit: 42 % (ref 34.0–46.6)
Hemoglobin: 14.4 g/dL (ref 11.1–15.9)
Immature Grans (Abs): 0 10*3/uL (ref 0.0–0.1)
Immature Granulocytes: 0 %
Lymphocytes Absolute: 1.6 10*3/uL (ref 0.7–3.1)
Lymphs: 26 %
MCH: 32.9 pg (ref 26.6–33.0)
MCHC: 34.3 g/dL (ref 31.5–35.7)
MCV: 96 fL (ref 79–97)
Monocytes Absolute: 0.5 10*3/uL (ref 0.1–0.9)
Monocytes: 7 %
Neutrophils Absolute: 3.9 10*3/uL (ref 1.4–7.0)
Neutrophils: 63 %
Platelets: 196 10*3/uL (ref 150–450)
RBC: 4.38 x10E6/uL (ref 3.77–5.28)
RDW: 13 % (ref 11.7–15.4)
WBC: 6.1 10*3/uL (ref 3.4–10.8)

## 2024-04-04 LAB — COMPREHENSIVE METABOLIC PANEL WITH GFR
ALT: 25 IU/L (ref 0–32)
AST: 34 IU/L (ref 0–40)
Albumin: 4.4 g/dL (ref 3.8–4.9)
Alkaline Phosphatase: 102 IU/L (ref 44–121)
BUN/Creatinine Ratio: 11 (ref 9–23)
BUN: 10 mg/dL (ref 6–24)
Bilirubin Total: 0.9 mg/dL (ref 0.0–1.2)
CO2: 17 mmol/L — ABNORMAL LOW (ref 20–29)
Calcium: 9.3 mg/dL (ref 8.7–10.2)
Chloride: 103 mmol/L (ref 96–106)
Creatinine, Ser: 0.92 mg/dL (ref 0.57–1.00)
Globulin, Total: 2.5 g/dL (ref 1.5–4.5)
Glucose: 107 mg/dL — ABNORMAL HIGH (ref 70–99)
Potassium: 4.7 mmol/L (ref 3.5–5.2)
Sodium: 141 mmol/L (ref 134–144)
Total Protein: 6.9 g/dL (ref 6.0–8.5)
eGFR: 73 mL/min/{1.73_m2} (ref >59)

## 2024-04-04 LAB — LIPID PANEL
Chol/HDL Ratio: 2.4 ratio (ref 0.0–4.4)
Cholesterol, Total: 207 mg/dL — ABNORMAL HIGH (ref 100–199)
HDL: 87 mg/dL (ref >39)
LDL Chol Calc (NIH): 96 mg/dL (ref 0–99)
Triglycerides: 145 mg/dL (ref 0–149)
VLDL Cholesterol Cal: 24 mg/dL (ref 5–40)

## 2024-04-04 LAB — TSH: TSH: 1.82 u[IU]/mL (ref 0.450–4.500)

## 2024-04-04 LAB — VITAMIN B12: Vitamin B-12: 393 pg/mL (ref 232–1245)

## 2024-04-04 LAB — HEMOGLOBIN A1C
Est. average glucose Bld gHb Est-mCnc: 105 mg/dL
Hgb A1c MFr Bld: 5.3 % (ref 4.8–5.6)

## 2024-07-07 ENCOUNTER — Other Ambulatory Visit: Payer: Self-pay | Admitting: Internal Medicine

## 2024-07-07 DIAGNOSIS — K219 Gastro-esophageal reflux disease without esophagitis: Secondary | ICD-10-CM
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
## 2024-07-13 ENCOUNTER — Encounter: Payer: Self-pay | Admitting: Internal Medicine

## 2024-07-13 ENCOUNTER — Ambulatory Visit: Payer: Self-pay

## 2024-07-13 ENCOUNTER — Ambulatory Visit: Admitting: Internal Medicine

## 2024-07-13 VITALS — BP 118/80 | HR 87 | Ht 68.0 in | Wt 199.0 lb

## 2024-07-13 DIAGNOSIS — H10023 Other mucopurulent conjunctivitis, bilateral: Secondary | ICD-10-CM

## 2024-07-13 MED ORDER — NEOMYCIN-POLYMYXIN-DEXAMETH 3.5-10000-0.1 OP SUSP: 2.0000 [drp] | Freq: Four times a day (QID) | OPHTHALMIC | 0 refills | Status: AC
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
## 2024-07-13 NOTE — Telephone Encounter (Signed)
 Noted  Pt has appt.  KP

## 2024-07-13 NOTE — Telephone Encounter (Signed)
 FYI Only or Action Required?: FYI only for provider.  Patient was last seen in primary care on 04/03/2024 by Justus Leita DEL, MD.  Called Nurse Triage reporting Eye Problem.  Symptoms began yesterday.  Interventions attempted: Nothing.  Symptoms are: unchanged.  Triage Disposition: See HCP Within 4 Hours (Or PCP Triage)  Patient/caregiver understands and will follow disposition?: Yes  Copied from CRM #[id number]. Topic: Clinical - Red Word Triage >> Jul 13, 2024  1:13 PM Winona R wrote: Extremely itchy eyes and red with discharge Reason for Disposition  MODERATE eye pain (e.g., interferes with normal activities)  Answer Assessment - Initial Assessment Questions 1. EYE DISCHARGE: Is the discharge in one or both eyes? What color is it? How much is there? When did the discharge start?      Discharge is mainly in the right eye. Yellow discharge-crusty discharge currently. Discharge was noticed yesterday.  2. REDNESS OF SCLERA: Is there redness in the white of the eye? If Yes, ask: Is it in one or both eyes? When did the redness start?     Yes, redness started yesterday 3. EYELIDS: Are the eyelids red or swollen? If Yes, ask: How much?      Right eyelid is swollen 4. VISION: Do you have blurred vision?     Yes but patient states she has blurred vision at baseline 5. PAIN: Is there any pain? If Yes, ask: How bad is the pain? (Scale 0-10; or none, mild, moderate, severe)     Mild  6. CONTACT LENS: Do you wear contacts?     no 7. OTHER SYMPTOMS: Do you have any other symptoms? (e.g., fever, runny nose, cough)     Runny nose, cough, sore throat  Protocols used: Eye - Pus or Discharge-A-AH

## 2024-07-16 ENCOUNTER — Encounter: Payer: Self-pay | Admitting: Internal Medicine

## 2024-07-16 ENCOUNTER — Ambulatory Visit: Admitting: Internal Medicine

## 2024-07-16 ENCOUNTER — Ambulatory Visit: Payer: Self-pay

## 2024-07-16 VITALS — BP 118/74 | HR 106 | Temp 98.2°F | Ht 68.0 in | Wt 198.4 lb

## 2024-07-16 DIAGNOSIS — J0100 Acute maxillary sinusitis, unspecified: Secondary | ICD-10-CM

## 2024-07-16 MED ORDER — AZITHROMYCIN 250 MG PO TABS: ORAL_TABLET | ORAL | 0 refills | Status: AC

## 2024-07-16 MED ORDER — PROMETHAZINE-DM 6.25-15 MG/5ML PO SYRP: 5.0000 mL | ORAL_SOLUTION | Freq: Four times a day (QID) | ORAL | 0 refills | Status: AC | PRN

## 2024-07-16 NOTE — Patient Instructions (Signed)
 Continue Flonase nasal spray, Allegra, Fluids Take sudafed twice a day

## 2024-07-16 NOTE — Telephone Encounter (Signed)
 This RN made a 2nd attempt to contact patient. No answer, left voicemail with call back number.
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
## 2024-07-16 NOTE — Telephone Encounter (Signed)
 FYI Only or Action Required?: Action required by provider: request for appointment.  Patient was last seen in primary care on 07/13/2024 by Justus Leita DEL, MD.  Called Nurse Triage reporting Sore Throat.  Symptoms began several days ago.  Interventions attempted: Nothing.  Symptoms are: unchanged.  Triage Disposition: See PCP When Office is Open (Within 3 Days)  Patient/caregiver understands and will follow disposition?: Yes   Reason for Disposition  [1] Sore throat with cough/cold symptoms AND [2] present > 5 days  Answer Assessment - Initial Assessment Questions Appt 07/16/24. Advised UC/ED if symptoms worsen.  1. ONSET: When did the throat start hurting? (Hours or days ago)      Saturday, but got worse yesterday 2. SEVERITY: How bad is the sore throat? (Scale 1-10; mild, moderate or severe)     Denies drooling. Reports painful swallowing, 9/10 3. STREP EXPOSURE: Has there been any exposure to strep within the past week? If Yes, ask: What type of contact occurred?      coworkers 4.  VIRAL SYMPTOMS: Are there any symptoms of a cold, such as a runny nose, cough, hoarse voice or red eyes?      Coughing, runny nose, yellowish productive cough, already seen for eyes 5. FEVER: Do you have a fever? If Yes, ask: What is your temperature, how was it measured, and when did it start?     no 6. PUS ON THE TONSILS: Is there pus on the tonsils in the back of your throat?     Unable to view 7. OTHER SYMPTOMS: Do you have any other symptoms? (e.g., difficulty breathing, headache, rash)     Hx of asthma; diff breath-reports when I'm like this with my asthma, HA slight,  Denies dizziness, N/V/D  Protocols used: Sore Throat-A-AH

## 2024-07-16 NOTE — Telephone Encounter (Signed)
 1st attempt- No answer, LVM  Copied from CRM #[id number]. Topic: Clinical - Medication Question >> Jul 16, 2024  8:32 AM Myrick T wrote: Reason for CRM: patient called said she was sneezing and coughing yesterday and this morning she woke up feeling like her throat was on fire. Patient said she was just in the office and is requesting something for her symptoms

## 2024-07-16 NOTE — Telephone Encounter (Signed)
 Noted  Pt has appt.  KP
